# Patient Record
Sex: Male | Born: 1980 | Race: White | Hispanic: No | Marital: Single | Smoking: Never smoker
Health system: Southern US, Community
[De-identification: ages and names within clinical notes are randomized; demographics above are authoritative.]

## PROBLEM LIST (undated history)

## (undated) DIAGNOSIS — G8250 Quadriplegia, unspecified: Secondary | ICD-10-CM

---
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
## 2003-11-22 DIAGNOSIS — G8250 Quadriplegia, unspecified: Secondary | ICD-10-CM

## 2003-11-22 HISTORY — DX: Quadriplegia, unspecified: G82.50

## 2003-11-22 HISTORY — PX: SPINAL FUSION: SHX223

## 2008-05-06 ENCOUNTER — Ambulatory Visit: Payer: Self-pay | Admitting: Urology

## 2008-12-08 ENCOUNTER — Inpatient Hospital Stay (HOSPITAL_COMMUNITY): Admission: EM | Admit: 2008-12-08 | Discharge: 2008-12-11 | Payer: Self-pay | Admitting: Emergency Medicine

## 2008-12-10 ENCOUNTER — Ambulatory Visit: Payer: Self-pay | Admitting: Infectious Diseases

## 2009-11-17 ENCOUNTER — Emergency Department (HOSPITAL_COMMUNITY): Admission: EM | Admit: 2009-11-17 | Discharge: 2009-11-17 | Payer: Self-pay | Admitting: Emergency Medicine

## 2011-02-21 LAB — CBC
Hemoglobin: 13.9 g/dL (ref 13.0–17.0)
MCHC: 33.2 g/dL (ref 30.0–36.0)
MCV: 88.8 fL (ref 78.0–100.0)
RBC: 4.72 MIL/uL (ref 4.22–5.81)
WBC: 6.5 10*3/uL (ref 4.0–10.5)

## 2011-02-21 LAB — URINE MICROSCOPIC-ADD ON

## 2011-02-21 LAB — URINALYSIS, ROUTINE W REFLEX MICROSCOPIC
Bilirubin Urine: NEGATIVE
Glucose, UA: NEGATIVE mg/dL
Hgb urine dipstick: NEGATIVE
Protein, ur: NEGATIVE mg/dL
Urobilinogen, UA: 0.2 mg/dL (ref 0.0–1.0)

## 2011-02-21 LAB — POCT I-STAT, CHEM 8
Calcium, Ion: 1.19 mmol/L (ref 1.12–1.32)
Chloride: 102 mEq/L (ref 96–112)
Creatinine, Ser: 0.8 mg/dL (ref 0.4–1.5)
Glucose, Bld: 82 mg/dL (ref 70–99)
Potassium: 4.2 mEq/L (ref 3.5–5.1)

## 2011-02-21 LAB — DIFFERENTIAL
Lymphs Abs: 1.3 10*3/uL (ref 0.7–4.0)
Monocytes Absolute: 0.6 10*3/uL (ref 0.1–1.0)
Monocytes Relative: 9 % (ref 3–12)
Neutro Abs: 4.5 10*3/uL (ref 1.7–7.7)
Neutrophils Relative %: 69 % (ref 43–77)

## 2011-03-07 LAB — COMPREHENSIVE METABOLIC PANEL
Albumin: 3.8 g/dL (ref 3.5–5.2)
BUN: 12 mg/dL (ref 6–23)
Calcium: 9.3 mg/dL (ref 8.4–10.5)
Chloride: 99 mEq/L (ref 96–112)
Creatinine, Ser: 1.27 mg/dL (ref 0.4–1.5)
GFR calc non Af Amer: >60 mL/min (ref >60)
Total Bilirubin: 1 mg/dL (ref 0.3–1.2)

## 2011-03-07 LAB — DIFFERENTIAL
Basophils Absolute: 0 10*3/uL (ref 0.0–0.1)
Basophils Relative: 0 % (ref 0–1)
Monocytes Relative: 9 % (ref 3–12)
Neutro Abs: 12.2 10*3/uL — ABNORMAL HIGH (ref 1.7–7.7)
Neutrophils Relative %: 84 % — ABNORMAL HIGH (ref 43–77)

## 2011-03-07 LAB — URINE MICROSCOPIC-ADD ON

## 2011-03-07 LAB — BASIC METABOLIC PANEL
BUN: 11 mg/dL (ref 6–23)
BUN: 6 mg/dL (ref 6–23)
Chloride: 109 mEq/L (ref 96–112)
Creatinine, Ser: 0.67 mg/dL (ref 0.4–1.5)
GFR calc Af Amer: >60 mL/min (ref >60)
GFR calc non Af Amer: >60 mL/min (ref >60)
Glucose, Bld: 103 mg/dL — ABNORMAL HIGH (ref 70–99)
Potassium: 3.4 mEq/L — ABNORMAL LOW (ref 3.5–5.1)

## 2011-03-07 LAB — URINALYSIS, ROUTINE W REFLEX MICROSCOPIC
Bilirubin Urine: NEGATIVE
Glucose, UA: NEGATIVE mg/dL
Ketones, ur: 15 mg/dL — AB
Specific Gravity, Urine: 1.01 (ref 1.005–1.030)
pH: 6 (ref 5.0–8.0)

## 2011-03-07 LAB — RETICULOCYTES
RBC.: 4.26 MIL/uL (ref 4.22–5.81)
Retic Count, Absolute: 34.1 10*3/uL (ref 19.0–186.0)

## 2011-03-07 LAB — CBC
HCT: 35.7 % — ABNORMAL LOW (ref 39.0–52.0)
HCT: 41.9 % (ref 39.0–52.0)
Hemoglobin: 11.8 g/dL — ABNORMAL LOW (ref 13.0–17.0)
Hemoglobin: 14 g/dL (ref 13.0–17.0)
MCHC: 32.9 g/dL (ref 30.0–36.0)
MCHC: 33.4 g/dL (ref 30.0–36.0)
MCV: 89 fL (ref 78.0–100.0)
Platelets: 127 10*3/uL — ABNORMAL LOW (ref 150–400)
RDW: 14.3 % (ref 11.5–15.5)
RDW: 14.5 % (ref 11.5–15.5)
WBC: 6.7 10*3/uL (ref 4.0–10.5)

## 2011-03-07 LAB — FERRITIN: Ferritin: 208 ng/mL (ref 22–322)

## 2011-03-07 LAB — CULTURE, BLOOD (ROUTINE X 2): Culture: NO GROWTH

## 2011-03-07 LAB — URINE CULTURE

## 2011-03-07 LAB — MAGNESIUM: Magnesium: 2.1 mg/dL (ref 1.5–2.5)

## 2011-03-07 LAB — FOLATE
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
## 2011-04-05 NOTE — Discharge Summary (Signed)
Matthew Davenport, Matthew Davenport               ACCOUNT NO.:  [id number]   MEDICAL RECORD NO.:  [id number]          PATIENT TYPE:  INP   LOCATION:  0159                         FACILITY:  [facility]   PHYSICIAN:  Michelene Gardener, MD    DATE OF BIRTH:  [birth year]   DATE OF ADMISSION:  12/08/2008  DATE OF DISCHARGE:                               DISCHARGE SUMMARY   Initial discharge summary was done by Marcellus Scott, MD   ADDENDUM:  Addendum to discharge.  The patient was supposed to go home  yesterday on January 20th but he was having some nausea and he vomited  once so his discharge was held and he was observed for 24 hours.  Since  that time the patient has been doing good.  Currently, this morning he  is asymptomatic.  He denied nausea, there is no vomiting and there is no  abdominal pain.  His abdominal exam is negative without tenderness.  The  patient was evaluated by infectious disease yesterday and he was  recommended to continue on Ceftin for 8 more days.  He will be  discharged home today on all preadmission medications in addition to  Ceftin.  For more details about his hospitalization please check  previous discharge summary done by Dr. Marcellus Scott on January 20th.  Total assessment time 50 minutes.      Michelene Gardener, MD  Electronically Signed     NAE/MEDQ  D:  12/11/2008  T:  12/11/2008  Job:  [id number]

## 2015-11-25 DIAGNOSIS — I82513 Chronic embolism and thrombosis of femoral vein, bilateral: Secondary | ICD-10-CM | POA: Insufficient documentation

## 2017-11-22 ENCOUNTER — Emergency Department: Payer: BC Managed Care – PPO

## 2017-11-22 ENCOUNTER — Other Ambulatory Visit: Payer: Self-pay

## 2017-11-22 ENCOUNTER — Encounter: Payer: Self-pay | Admitting: Emergency Medicine

## 2017-11-22 ENCOUNTER — Inpatient Hospital Stay
Admission: EM | Admit: 2017-11-22 | Discharge: 2017-11-24 | DRG: 872 | Disposition: A | Discharge status: Home / Self Care | Payer: BC Managed Care – PPO | Attending: Internal Medicine | Admitting: Internal Medicine

## 2017-11-22 DIAGNOSIS — G8222 Paraplegia, incomplete: Secondary | ICD-10-CM | POA: Diagnosis present

## 2017-11-22 DIAGNOSIS — Z86718 Personal history of other venous thrombosis and embolism: Secondary | ICD-10-CM | POA: Diagnosis not present

## 2017-11-22 DIAGNOSIS — M62838 Other muscle spasm: Secondary | ICD-10-CM | POA: Diagnosis present

## 2017-11-22 DIAGNOSIS — N340 Urethral abscess: Secondary | ICD-10-CM | POA: Diagnosis present

## 2017-11-22 DIAGNOSIS — Z888 Allergy status to other drugs, medicaments and biological substances status: Secondary | ICD-10-CM | POA: Diagnosis not present

## 2017-11-22 DIAGNOSIS — N319 Neuromuscular dysfunction of bladder, unspecified: Secondary | ICD-10-CM | POA: Diagnosis present

## 2017-11-22 DIAGNOSIS — Z981 Arthrodesis status: Secondary | ICD-10-CM

## 2017-11-22 DIAGNOSIS — Z7901 Long term (current) use of anticoagulants: Secondary | ICD-10-CM

## 2017-11-22 DIAGNOSIS — N5089 Other specified disorders of the male genital organs: Secondary | ICD-10-CM | POA: Diagnosis present

## 2017-11-22 DIAGNOSIS — A419 Sepsis, unspecified organism: Secondary | ICD-10-CM | POA: Diagnosis not present

## 2017-11-22 DIAGNOSIS — N453 Epididymo-orchitis: Secondary | ICD-10-CM | POA: Diagnosis present

## 2017-11-22 DIAGNOSIS — Z79899 Other long term (current) drug therapy: Secondary | ICD-10-CM

## 2017-11-22 DIAGNOSIS — Z91018 Allergy to other foods: Secondary | ICD-10-CM | POA: Diagnosis not present

## 2017-11-22 HISTORY — DX: Quadriplegia, unspecified: G82.50

## 2017-11-22 LAB — HEMOGLOBIN A1C
Hgb A1c MFr Bld: 5.2 % (ref 4.8–5.6)
Mean Plasma Glucose: 102.54 mg/dL

## 2017-11-22 LAB — URINALYSIS, ROUTINE W REFLEX MICROSCOPIC
Bilirubin Urine: NEGATIVE
Glucose, UA: NEGATIVE mg/dL
Ketones, ur: 20 mg/dL — AB
Leukocytes, UA: NEGATIVE
NITRITE: NEGATIVE
Protein, ur: NEGATIVE mg/dL
SPECIFIC GRAVITY, URINE: 1.006 (ref 1.005–1.030)
pH: 6 (ref 5.0–8.0)

## 2017-11-22 LAB — CBC WITH DIFFERENTIAL/PLATELET
BASOS ABS: 0.1 10*3/uL (ref 0–0.1)
Basophils Relative: 1 %
Eosinophils Absolute: 0 10*3/uL (ref 0–0.7)
Eosinophils Relative: 0 %
HCT: 41.9 % (ref 40.0–52.0)
Hemoglobin: 14.1 g/dL (ref 13.0–18.0)
Lymphocytes Relative: 2 %
Lymphs Abs: 0.3 10*3/uL — ABNORMAL LOW (ref 1.0–3.6)
MCH: 28.2 pg (ref 26.0–34.0)
MCHC: 33.6 g/dL (ref 32.0–36.0)
MCV: 83.9 fL (ref 80.0–100.0)
MONO ABS: 0.6 10*3/uL (ref 0.2–1.0)
MONOS PCT: 4 %
NEUTROS ABS: 14.6 10*3/uL — AB (ref 1.4–6.5)
Neutrophils Relative %: 93 %
Platelets: 194 10*3/uL (ref 150–440)
RBC: 5 MIL/uL (ref 4.40–5.90)
RDW: 14.4 % (ref 11.5–14.5)
WBC: 15.7 10*3/uL — ABNORMAL HIGH (ref 3.8–10.6)

## 2017-11-22 LAB — COMPREHENSIVE METABOLIC PANEL
ALBUMIN: 4.2 g/dL (ref 3.5–5.0)
ALT: 16 U/L — ABNORMAL LOW (ref 17–63)
ANION GAP: 10 (ref 5–15)
AST: 25 U/L (ref 15–41)
Alkaline Phosphatase: 42 U/L (ref 38–126)
BUN: 10 mg/dL (ref 6–20)
CHLORIDE: 102 mmol/L (ref 101–111)
CO2: 24 mmol/L (ref 22–32)
Calcium: 9.3 mg/dL (ref 8.9–10.3)
Creatinine, Ser: 0.58 mg/dL — ABNORMAL LOW (ref 0.61–1.24)
GFR calc Af Amer: >60 mL/min (ref >60)
GFR calc non Af Amer: >60 mL/min (ref >60)
GLUCOSE: 113 mg/dL — AB (ref 65–99)
Potassium: 3.5 mmol/L (ref 3.5–5.1)
SODIUM: 136 mmol/L (ref 135–145)
TOTAL PROTEIN: 8 g/dL (ref 6.5–8.1)
Total Bilirubin: 1.2 mg/dL (ref 0.3–1.2)

## 2017-11-22 LAB — TSH: TSH: 0.387 u[IU]/mL (ref 0.350–4.500)

## 2017-11-22 LAB — LACTIC ACID, PLASMA: LACTIC ACID, VENOUS: 1 mmol/L (ref 0.5–1.9)

## 2017-11-22 MED ORDER — BACLOFEN 5 MG HALF TABLET
25.0000 mg | ORAL_TABLET | Freq: Four times a day (QID) | ORAL | Status: DC
  Filled 2017-11-22: qty 1

## 2017-11-22 MED ORDER — ACETAMINOPHEN 325 MG PO TABS
650.0000 mg | ORAL_TABLET | Freq: Four times a day (QID) | ORAL | Status: DC | PRN
  Administered 2017-11-22 – 2017-11-23 (×3): 650 mg via ORAL
  Filled 2017-11-22 (×3): qty 2

## 2017-11-22 MED ORDER — APIXABAN 5 MG PO TABS
5.0000 mg | ORAL_TABLET | Freq: Two times a day (BID) | ORAL | Status: DC
  Administered 2017-11-22 – 2017-11-24 (×5): 5 mg via ORAL
  Filled 2017-11-22 (×6): qty 1

## 2017-11-22 MED ORDER — PIPERACILLIN-TAZOBACTAM 3.375 G IVPB 30 MIN
3.3750 g | Freq: Once | INTRAVENOUS | Status: AC
  Administered 2017-11-22: 3.375 g via INTRAVENOUS
  Filled 2017-11-22: qty 50

## 2017-11-22 MED ORDER — ACETAMINOPHEN 650 MG RE SUPP: 650.0000 mg | Freq: Four times a day (QID) | RECTAL | Status: DC | PRN

## 2017-11-22 MED ORDER — TAMSULOSIN HCL 0.4 MG PO CAPS
0.4000 mg | ORAL_CAPSULE | Freq: Every day | ORAL | Status: DC
  Administered 2017-11-22 – 2017-11-24 (×2): 0.4 mg via ORAL
  Filled 2017-11-22 (×3): qty 1

## 2017-11-22 MED ORDER — INFLUENZA VAC SPLIT QUAD 0.5 ML IM SUSY
0.5000 mL | PREFILLED_SYRINGE | INTRAMUSCULAR | Status: DC
  Filled 2017-11-22: qty 0.5

## 2017-11-22 MED ORDER — DIAZEPAM 5 MG PO TABS
2.5000 mg | ORAL_TABLET | Freq: Every evening | ORAL | Status: DC | PRN
  Administered 2017-11-24: 2.5 mg via ORAL
  Filled 2017-11-22: qty 1

## 2017-11-22 MED ORDER — BACLOFEN 10 MG PO TABS
25.0000 mg | ORAL_TABLET | Freq: Four times a day (QID) | ORAL | Status: DC
  Administered 2017-11-22: 25 mg via ORAL
  Filled 2017-11-22 (×3): qty 2.5

## 2017-11-22 MED ORDER — FINASTERIDE 1 MG PO TABS
1.0000 mg | ORAL_TABLET | Freq: Every day | ORAL | Status: DC
  Administered 2017-11-23: 1 mg via ORAL
  Filled 2017-11-22 (×2): qty 1

## 2017-11-22 MED ORDER — VANCOMYCIN HCL IN DEXTROSE 1-5 GM/200ML-% IV SOLN
1000.0000 mg | Freq: Three times a day (TID) | INTRAVENOUS | Status: DC
  Administered 2017-11-22 (×3): 1000 mg via INTRAVENOUS
  Filled 2017-11-22 (×4): qty 200

## 2017-11-22 MED ORDER — SENNOSIDES-DOCUSATE SODIUM 8.6-50 MG PO TABS
2.0000 | ORAL_TABLET | Freq: Every day | ORAL | Status: DC
  Administered 2017-11-22 – 2017-11-23 (×2): 2 via ORAL
  Filled 2017-11-22 (×2): qty 2

## 2017-11-22 MED ORDER — BACLOFEN 10 MG PO TABS
25.0000 mg | ORAL_TABLET | Freq: Four times a day (QID) | ORAL | Status: DC
  Administered 2017-11-22 – 2017-11-24 (×7): 25 mg via ORAL
  Filled 2017-11-22 (×9): qty 2.5

## 2017-11-22 MED ORDER — PIPERACILLIN-TAZOBACTAM 3.375 G IVPB
3.3750 g | Freq: Three times a day (TID) | INTRAVENOUS | Status: DC
  Administered 2017-11-22 – 2017-11-23 (×4): 3.375 g via INTRAVENOUS
  Filled 2017-11-22 (×4): qty 50

## 2017-11-22 MED ORDER — ONDANSETRON HCL 4 MG/2ML IJ SOLN: 4.0000 mg | Freq: Four times a day (QID) | INTRAMUSCULAR | Status: DC | PRN

## 2017-11-22 MED ORDER — SODIUM CHLORIDE 0.9 % IV SOLN
INTRAVENOUS | Status: DC
  Administered 2017-11-22 – 2017-11-24 (×3): via INTRAVENOUS

## 2017-11-22 MED ORDER — ONDANSETRON HCL 4 MG PO TABS: 4.0000 mg | ORAL_TABLET | Freq: Four times a day (QID) | ORAL | Status: DC | PRN

## 2017-11-22 MED ORDER — VANCOMYCIN HCL IN DEXTROSE 1-5 GM/200ML-% IV SOLN
1000.0000 mg | Freq: Once | INTRAVENOUS | Status: AC
  Administered 2017-11-22: 1000 mg via INTRAVENOUS
  Filled 2017-11-22: qty 200

## 2017-11-22 MED ORDER — DOCUSATE SODIUM 100 MG PO CAPS
100.0000 mg | ORAL_CAPSULE | Freq: Two times a day (BID) | ORAL | Status: DC
  Administered 2017-11-22: 100 mg via ORAL
  Filled 2017-11-22 (×5): qty 1

## 2017-11-22 MED ORDER — BACLOFEN 5 MG HALF TABLET
25.0000 mg | ORAL_TABLET | Freq: Four times a day (QID) | ORAL | Status: DC
  Administered 2017-11-22: 25 mg via ORAL
  Filled 2017-11-22 (×4): qty 1

## 2017-11-22 NOTE — Progress Notes (Signed)
Pharmacy Antibiotic Note  DAYVON DAX is a 37 y.o. male admitted on 11/22/2017 with sepsis s/t to swollen testicle.  Pharmacy has been consulted for vanc/zosyn dosing.  Plan: Patient received vanc 1g and zosyn 3.375g IV x 1 in ED  Will f/u w/ vanc 1g IV q8h w/ 6 hour stack dose Will draw vanc trough 01/03 @ 0600 prior to 4th  Will continue zosyn 3.375g IV q8h  Ke 0.1049 T1/2 6 ~ 8 hrs Goal trough 15 - 20 mcg/mL  Height: 6\' 1"  (185.4 cm) Weight: 145 lb (65.8 kg) IBW/kg (Calculated) : 79.9  Temp (24hrs), Avg:98.8 F (37.1 C), Min:98.4 F (36.9 C), Max:99.1 F (37.3 C)  Recent Labs  Lab 11/22/17 0055  WBC 15.7*  CREATININE 0.58*  LATICACIDVEN 1.0    Estimated Creatinine Clearance: 121.1 mL/min (A) (by C-G formula based on SCr of 0.58 mg/dL (L)).    Allergies  Allergen Reactions  . Banana Nausea Only    Stomach Cramps   . Ferrous Gluconate Nausea And Vomiting    Thank you for allowing pharmacy to be a part of this patient's care.  Tobie Lords, PharmD, BCPS Clinical Pharmacist 11/22/2017

## 2017-11-22 NOTE — ED Provider Notes (Signed)
[facility] Emergency Department Provider Note    First MD Initiated Contact with Patient 11/22/17 [phone]     (approximate)  I have reviewed the triage vital signs and the nursing notes.   HISTORY  Chief Complaint No chief complaint on file.    HPI Matthew Davenport is a 37 y.o. 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  Patient's mother noted redness to the testicle patient was then noted to be febrile with a temperature of 101.  In addition the patient noted muscle spasm of the lower abdominal wall.  Of note patient states that he was recently treated with Cipro for UTI.  Patient states that symptoms are consistent with when he was diagnosed with bilateral lower extremity DVTs.   Past Medical History:  Diagnosis Date  . Tetraplegia ([place]) 2005    There are no active problems to display for this patient.   Past Surgical History:  Procedure Laterality Date  . SPINAL FUSION  2005  . [place]    Prior to Admission medications   Not on File    Allergies No known drug allergies No family history on file.  Social History Social History   Tobacco Use  . Smoking status: Not on file  Substance Use Topics  . Alcohol use: Not on file  . Drug use: Not on file    Review of Systems Constitutional: No fever/chills Eyes: No visual changes. ENT: No sore throat. Cardiovascular: Denies chest pain. Respiratory: Denies shortness of breath. Gastrointestinal: No abdominal pain.  No nausea, no vomiting.  No diarrhea.  No constipation. Genitourinary: Negative for dysuria.  Positive for scrotal swelling and redness Musculoskeletal: Negative for neck pain.  Negative for back pain. Integumentary: Negative for rash. Neurological:  Negative for headaches, focal weakness or numbness.   ____________________________________________   PHYSICAL EXAM:  VITAL SIGNS: ED Triage Vitals  Enc Vitals Group     BP 11/22/17 0045 (!) 163/113     Pulse Rate 11/22/17 0045 (!) 103     Resp 11/22/17 0045 18     Temp 11/22/17 0045 98.4 F (36.9 C)     Temp Source 11/22/17 0045 Oral     SpO2 11/22/17 0045 97 %     Weight 11/22/17 0050 65.8 kg (145 lb)     Height --      Head Circumference --      Peak Flow --      Pain Score --      Pain Loc --      Pain Edu? --      Excl. in [place]? --     Constitutional: Alert and oriented. Well appearing and in no acute distress. Eyes: Conjunctivae are normal. Head: Atraumatic. Mouth/Throat: Mucous membranes are moist.  Oropharynx non-erythematous. Neck: No stridor.   Cardiovascular: Tachycardia regular rhythm. Good peripheral circulation. Grossly normal heart sounds. Respiratory: Normal respiratory effort.  No retractions. Lungs CTAB. Gastrointestinal: Soft and nontender. No distention.  Genitourinary: Blanching scrotal erythema and large left testicle Musculoskeletal: No lower extremity tenderness nor edema. No gross deformities of extremities. Neurologic:  Normal speech and language. No gross focal neurologic deficits are appreciated.  Skin:  Skin is warm, dry and intact. No rash noted. Psychiatric: Mood and affect are normal. Speech and behavior are normal.  ____________________________________________  LABS (all labs ordered are listed, but only abnormal results are displayed)  Labs Reviewed  COMPREHENSIVE METABOLIC PANEL - Abnormal; Notable for the following components:      Result Value   Glucose, Bld 113 (*)    Creatinine, Ser 0.58 (*)    ALT 16 (*)    All other components within normal limits  CBC WITH DIFFERENTIAL/PLATELET - Abnormal; Notable for the following components:   WBC 15.7 (*)    Neutro Abs 14.6 (*)    Lymphs Abs 0.3 (*)    All other components within  normal limits  CULTURE, BLOOD (ROUTINE X 2)  CULTURE, BLOOD (ROUTINE X 2)  URINE CULTURE  LACTIC ACID, PLASMA  URINALYSIS, ROUTINE W REFLEX MICROSCOPIC  LACTIC ACID, PLASMA   ____________________________________________  EKG  ED ECG REPORT I, [place] N Roiza Wiedel, the attending physician, personally viewed and interpreted this ECG.   Date: 11/22/2017  EKG Time: 3:46 AM  Rate: 91  Rhythm: Normal sinus rhythm  Axis: Normal  Intervals: Normal  ST&T Change: None  ____________________________________________  RADIOLOGY I, [place] N Kamden Stanislaw, personally viewed and evaluated these images (plain radiographs) as part of my medical decision making, as well as reviewing the written report by the radiologist.  [place] Scrotom W/doppler  Result Date: 11/22/2017 CLINICAL DATA:  Acute onset of bilateral scrotal swelling. EXAM: SCROTAL ULTRASOUND DOPPLER ULTRASOUND OF THE TESTICLES TECHNIQUE: Complete ultrasound examination of the testicles, epididymis, and other scrotal structures was performed. Color and spectral Doppler ultrasound were also utilized to evaluate blood flow to the testicles. COMPARISON:  None. FINDINGS: Right testicle Measurements: 4.1 x 2.3 x 2.6 cm. No mass or microlithiasis visualized. Left testicle Measurements: 4.6 x 3.5 x 3.5 cm. No mass visualized. Diffusely heterogeneous in appearance, and asymmetrically enlarged, with underlying hyperemia, raising concern for orchitis. A single small calcification is noted within the left testis. Right epididymis:  A small 0.4 cm epididymal head cyst is noted. Left epididymis: Asymmetrically enlarged, with hyperemia, concerning for epididymitis. Hydrocele: Complex fluid with diffuse internal echoes is noted surrounding the left testis and epididymis, concerning for small pyocele. Varicocele:  None visualized. Pulsed Doppler interrogation of both testes demonstrates normal low resistance arterial and venous waveforms bilaterally. There is asymmetric  scrotal wall thickening on the left. IMPRESSION: 1. No evidence of testicular torsion. 2. Left-sided epididymo-orchitis, with small left-sided pyocele. No gas is seen at this time. 3. Left-sided scrotal wall thickening reflects overlying cellulitis. These results were called by telephone at the time of interpretation on 11/22/2017 at 3:46 am to Dr. Marjean Donna , who verbally acknowledged these results. Electronically Signed   By: Garald Balding M.D.   On: 11/22/2017 03:49    ____________________________________________   PROCEDURES  Critical Care performed: CRITICAL CARE Performed by: Gregor Hams   Total critical care time: 45 minutes  Critical care time was exclusive of separately billable procedures and treating other patients.  Critical care was necessary to treat or prevent imminent or life-threatening deterioration.  Critical care was time spent personally by me on the following activities: development of treatment plan with patient and/or surrogate as well as nursing, discussions with consultants, evaluation of patient's response to treatment, examination of patient, obtaining history from patient or surrogate, ordering and performing treatments and interventions, ordering and review of laboratory studies, ordering and review of radiographic studies, pulse oximetry and re-evaluation of patient's condition.  Procedures   ____________________________________________   INITIAL IMPRESSION / ASSESSMENT AND PLAN / ED COURSE  As part of my  medical decision making, I reviewed the following data within the electronic MEDICAL RECORD NUMBER4 year old male presented with above-stated history and physical exam concerning for possible epididymitis versus epididymal orchitis with overlying scrotal cellulitis no evidence of necrosis noted on the scrotum.  Patient met sepsis criteria on arrival as patient is tachycardic hypotensive history of fever.  Laboratory data notable for leukocytosis with a  white blood cell count of 15.7.  Patient received IV vancomycin 1 g and Zosyn 3.375 g while in the emergency department.  Patient discussed with Dr. Junious Silk urology after reviewing ultrasound which revealed left epididymal orchitis with pyocele.  Dr. Junious Silk will evaluate the patient in the emergency department.  Patient discussed with Dr. Marcille Blanco for hospital admission for further evaluation and management ____________________________________________  FINAL CLINICAL IMPRESSION(S) / ED DIAGNOSES  Final diagnoses:  Epididymoorchitis  Sepsis, due to unspecified organism [facility])     MEDICATIONS GIVEN DURING THIS VISIT:  Medications  piperacillin-tazobactam (ZOSYN) IVPB 3.375 g (0 g Intravenous Stopped 11/22/17 0148)  vancomycin (VANCOCIN) IVPB 1000 mg/200 mL premix (0 mg Intravenous Stopped 11/22/17 0219)     ED Discharge Orders    None       Note:  This document was prepared using Dragon voice recognition software and may include unintentional dictation errors.    Gregor Hams, MD 11/22/17 [phone]

## 2017-11-22 NOTE — Progress Notes (Signed)
Bilateral scrotal swelling and pain. Vital signs reviewed. Sepsis due to Epididymitis With orchitis Continue current treatment with antibiotics, follow-up cultures and CBC.  I discussed with the patient and his mother, and RN.  Time spent about 25 minutes.

## 2017-11-22 NOTE — ED Notes (Signed)
Attempted to call report. Charge nurse will call back in a few minutes to take report.

## 2017-11-22 NOTE — Consult Note (Addendum)
1610: Full consult to follow - pt seen and examined in ED. Chart, [place] images and labs reviewed. No worrisome findings on exam - erythema and swelling of left testicle and epididymus noted with typical induration. Skin normal - no necrosis. No fluctuance, no crepitus.   Consult: Left epididymoorchitis Requested by: Dr. Marjean Donna  H&P [name] is a 37 year old paraplegic he does CIC for neurogenic bladder.  He went to a movie yesterday and then yesterday afternoon started to have malaise, dysreflexic type symptoms and fever.  His mom assists him with CIC and they noticed left-sided scrotal swelling.  He was brought to the emergency department where left-sided scrotal swelling and erythema was noted.  Ultrasound revealed hyperemia of the left testicle and epididymis and typical fluid around the testicle (this was described as a possible "pyocele" on the ultrasound). White count 15, urine bland.   He has a history of 2-3 cystitis episodes per year and follows with Dr. Tresa Moore. He was only doing CIC twice a day and reflex voiding with a condom catheter.  Dr. Tresa Moore increased frequency of catheterization to 4 times a day and is noted, but patient and mom tell me that her only doing it 3 times a day. Dr. Zettie Pho last office note, the patient had a CT scan in 2018 with punctate stones likely not contributing to UTI (on [place] - I reviewed all the images) and no hydronephrosis.  Patient is due for follow-up With Dr. Tresa Moore December 2019 for BMP, KUB and renal ultrasound.   Past Medical History:  Diagnosis Date  . Tetraplegia ([place]) 2005   Past Surgical History:  Procedure Laterality Date  . SPINAL FUSION  2005  . WISDOM TOOTH EXTRACTION  2000    Home Medications:  Medications Prior to Admission  Medication Sig Dispense Refill Last Dose  . baclofen (LIORESAL) 10 MG tablet Take 25 mg by mouth 4 (four) times daily.  0 11/21/2017 at Unknown time  . diazepam (VALIUM) 5 MG tablet Take 2.5 mg by mouth at  bedtime as needed for muscle spasms.   prn at prn  . ELIQUIS 5 MG TABS tablet Take 5 mg by mouth 2 (two) times daily.  0 11/21/2017 at Unknown time  . finasteride (PROPECIA) 1 MG tablet Take 1 mg by mouth daily.  0 11/21/2017 at Unknown time  . senna-docusate (SENOKOT-S) 8.6-50 MG tablet Take 2 tablets by mouth daily.   11/21/2017 at Unknown time  . tamsulosin (FLOMAX) 0.4 MG CAPS capsule Take 0.4 mg by mouth daily.  0 11/21/2017 at Unknown time   Allergies:  Allergies  Allergen Reactions  . Banana Nausea Only    Stomach Cramps   . Ferrous Gluconate Nausea And Vomiting    Family History  Problem Relation Age of Onset  . CAD Paternal Aunt    Social History:  has no tobacco, alcohol, and drug history on file.  ROS: A complete review of systems was performed.  All systems are negative except for pertinent findings as noted. Review of Systems  All other systems reviewed and are negative.    Physical Exam:  Vital signs in last 24 hours: Temp:  [98.4 F (36.9 C)-99.1 F (37.3 C)] 99.1 F (37.3 C) (01/02 9604) Pulse Rate:  [89-121] 101 (01/02 0632) Resp:  [12-24] 22 (01/02 0530) BP: (94-163)/(58-113) 94/58 (01/02 5409) SpO2:  [96 %-98 %] 96 % (01/02 8119) Weight:  [65.8 kg (145 lb)] 65.8 kg (145 lb) (01/02 0050) General:  Alert and oriented, No acute  distress HEENT: Normocephalic, atraumatic Cardiovascular: Regular rate and rhythm Lungs: Regular rate and effort Abdomen: Soft, nontender, nondistended, no abdominal masses Back: No CVA tenderness Extremities: No edema Neurologic: Grossly intact GU: condom cath in place - no penile swelling or erythema. Overall mild scrotal swelling, erythema left greater than right.  Skin is healthy without necrosis.  On exam the right testicle is palpably normal.  The left testicle and epididymis are indurated but there is no fluctuance or crepitus. The posterior scrotum appears normal and the perineum and inner thighs are normal.  Laboratory Data:   Results for orders placed or performed during the hospital encounter of 11/22/17 (from the past 24 hour(s))  Comprehensive metabolic panel     Status: Abnormal   Collection Time: 11/22/17 12:55 AM  Result Value Ref Range   Sodium 136 135 - 145 mmol/L   Potassium 3.5 3.5 - 5.1 mmol/L   Chloride 102 101 - 111 mmol/L   CO2 24 22 - 32 mmol/L   Glucose, Bld 113 (H) 65 - 99 mg/dL   BUN 10 6 - 20 mg/dL   Creatinine, Ser 0.58 (L) 0.61 - 1.24 mg/dL   Calcium 9.3 8.9 - 10.3 mg/dL   Total Protein 8.0 6.5 - 8.1 g/dL   Albumin 4.2 3.5 - 5.0 g/dL   AST 25 15 - 41 U/L   ALT 16 (L) 17 - 63 U/L   Alkaline Phosphatase 42 38 - 126 U/L   Total Bilirubin 1.2 0.3 - 1.2 mg/dL   GFR calc non Af Amer >60 >60 mL/min   GFR calc Af Amer >60 >60 mL/min   Anion gap 10 5 - 15  CBC WITH DIFFERENTIAL     Status: Abnormal   Collection Time: 11/22/17 12:55 AM  Result Value Ref Range   WBC 15.7 (H) 3.8 - 10.6 K/uL   RBC 5.00 4.40 - 5.90 MIL/uL   Hemoglobin 14.1 13.0 - 18.0 g/dL   HCT 41.9 40.0 - 52.0 %   MCV 83.9 80.0 - 100.0 fL   MCH 28.2 26.0 - 34.0 pg   MCHC 33.6 32.0 - 36.0 g/dL   RDW 14.4 11.5 - 14.5 %   Platelets 194 150 - 440 K/uL   Neutrophils Relative % 93 %   Neutro Abs 14.6 (H) 1.4 - 6.5 K/uL   Lymphocytes Relative 2 %   Lymphs Abs 0.3 (L) 1.0 - 3.6 K/uL   Monocytes Relative 4 %   Monocytes Absolute 0.6 0.2 - 1.0 K/uL   Eosinophils Relative 0 %   Eosinophils Absolute 0.0 0 - 0.7 K/uL   Basophils Relative 1 %   Basophils Absolute 0.1 0 - 0.1 K/uL  Blood Culture (routine x 2)     Status: None (Preliminary result)   Collection Time: 11/22/17 12:55 AM  Result Value Ref Range   Specimen Description BLOOD RT HAND    Special Requests      BOTTLES DRAWN AEROBIC AND ANAEROBIC Blood Culture results may not be optimal due to an excessive volume of blood received in culture bottles   Culture      NO GROWTH < 12 HOURS Performed at [facility], [address] [place], [place] [address]     Report Status PENDING   Blood Culture (routine x 2)     Status: None (Preliminary result)   Collection Time: 11/22/17 12:55 AM  Result Value Ref Range   Specimen Description BLOOD LT HAND    Special Requests  BOTTLES DRAWN AEROBIC AND ANAEROBIC Blood Culture results may not be optimal due to an excessive volume of blood received in culture bottles   Culture      NO GROWTH < 12 HOURS Performed at [facility], [address] [place], [place] [address]    Report Status PENDING   Lactic acid, plasma     Status: None   Collection Time: 11/22/17 12:55 AM  Result Value Ref Range   Lactic Acid, Venous 1.0 0.5 - 1.9 mmol/L  TSH     Status: None   Collection Time: 11/22/17 12:55 AM  Result Value Ref Range   TSH 0.387 0.350 - 4.500 uIU/mL  Urinalysis, Routine w reflex microscopic     Status: Abnormal   Collection Time: 11/22/17  3:55 AM  Result Value Ref Range   Color, Urine YELLOW (A) YELLOW   APPearance CLEAR (A) CLEAR   Specific Gravity, Urine 1.006 1.005 - 1.030   pH 6.0 5.0 - 8.0   Glucose, UA NEGATIVE NEGATIVE mg/dL   Hgb urine dipstick MODERATE (A) NEGATIVE   Bilirubin Urine NEGATIVE NEGATIVE   Ketones, ur 20 (A) NEGATIVE mg/dL   Protein, ur NEGATIVE NEGATIVE mg/dL   Nitrite NEGATIVE NEGATIVE   Leukocytes, UA NEGATIVE NEGATIVE   RBC / HPF 0-5 0 - 5 RBC/hpf   WBC, UA 0-5 0 - 5 WBC/hpf   Bacteria, UA RARE (A) NONE SEEN   Squamous Epithelial / LPF 0-5 (A) NONE SEEN   Sperm, UA PRESENT    Recent Results (from the past 240 hour(s))  Blood Culture (routine x 2)     Status: None (Preliminary result)   Collection Time: 11/22/17 12:55 AM  Result Value Ref Range Status   Specimen Description BLOOD RT HAND  Final   Special Requests   Final    BOTTLES DRAWN AEROBIC AND ANAEROBIC Blood Culture results may not be optimal due to an excessive volume of blood received in culture bottles   Culture   Final    NO GROWTH < 12 HOURS Performed at [facility], [address], [place], [place] [address]    Report Status PENDING  Incomplete  Blood Culture (routine x 2)     Status: None (Preliminary result)   Collection Time: 11/22/17 12:55 AM  Result Value Ref Range Status   Specimen Description BLOOD LT HAND  Final   Special Requests   Final    BOTTLES DRAWN AEROBIC AND ANAEROBIC Blood Culture results may not be optimal due to an excessive volume of blood received in culture bottles   Culture   Final    NO GROWTH < 12 HOURS Performed at [facility], [address], [place], [place] [address]    Report Status PENDING  Incomplete   Creatinine: Recent Labs    11/22/17 0055  CREATININE 0.58*   Office notes reviewed and CT images reviewed.  Impression/Assessment/plan: Epididymoorchitis-agree with broad-spectrum antibiotics, blood and urine cultures pending.  Neurogenic bladder-continue CIC 4 times a day.   Festus Aloe 11/22/2017, 8:11 AM

## 2017-11-22 NOTE — ED Notes (Signed)
Patient left for ultrasound.

## 2017-11-22 NOTE — ED Notes (Signed)
Nurse unable to get urine at this time due to ultrasound taking patient. Will collect urine when patient returns from u/s.

## 2017-11-22 NOTE — Progress Notes (Signed)
Pt alert and oriented. Resting in room. Family at bedside. Antibiotics infusing. Pt on room air. External cath in place. No complaints at this time. Will continue to monitor.

## 2017-11-22 NOTE — H&P (Signed)
Matthew Davenport is an 37 y.o. male.   Chief Complaint: Testicular swelling HPI: The patient with past medical history of incomplete tetraplegia presents to the emergency department after noticing swelling in his left testicle.  The patient uses urinary catheters and was told by his caregiver that his testicle was swollen and red.  Ultrasound in the emergency department showed orchitis and epididymitis with pyocele.  The patient met criteria for sepsis protocol which was initiated prior to the emergency department staff calling the hospitalist service for admission.  Past Medical History:  Diagnosis Date  . Tetraplegia ([place]) 2005    Past Surgical History:  Procedure Laterality Date  . SPINAL FUSION  2005  . WISDOM TOOTH EXTRACTION  2000    Family History  Problem Relation Age of Onset  . CAD Paternal Aunt    Social History:  has no tobacco, alcohol, and drug history on file.  Allergies:  Allergies  Allergen Reactions  . Banana Nausea Only    Stomach Cramps   . Ferrous Gluconate Nausea And Vomiting    Medications Prior to Admission  Medication Sig Dispense Refill  . baclofen (LIORESAL) 10 MG tablet Take 25 mg by mouth 4 (four) times daily.  0  . diazepam (VALIUM) 5 MG tablet Take 2.5 mg by mouth at bedtime as needed for muscle spasms.    Marland Kitchen ELIQUIS 5 MG TABS tablet Take 5 mg by mouth 2 (two) times daily.  0  . finasteride (PROPECIA) 1 MG tablet Take 1 mg by mouth daily.  0  . senna-docusate (SENOKOT-S) 8.6-50 MG tablet Take 2 tablets by mouth daily.    . tamsulosin (FLOMAX) 0.4 MG CAPS capsule Take 0.4 mg by mouth daily.  0    Results for orders placed or performed during the hospital encounter of 11/22/17 (from the past 48 hour(s))  Comprehensive metabolic panel     Status: Abnormal   Collection Time: 11/22/17 12:55 AM  Result Value Ref Range   Sodium 136 135 - 145 mmol/L   Potassium 3.5 3.5 - 5.1 mmol/L   Chloride 102 101 - 111 mmol/L   CO2 24 22 - 32 mmol/L   Glucose,  Bld 113 (H) 65 - 99 mg/dL   BUN 10 6 - 20 mg/dL   Creatinine, Ser 0.58 (L) 0.61 - 1.24 mg/dL   Calcium 9.3 8.9 - 10.3 mg/dL   Total Protein 8.0 6.5 - 8.1 g/dL   Albumin 4.2 3.5 - 5.0 g/dL   AST 25 15 - 41 U/L   ALT 16 (L) 17 - 63 U/L   Alkaline Phosphatase 42 38 - 126 U/L   Total Bilirubin 1.2 0.3 - 1.2 mg/dL   GFR calc non Af Amer >60 >60 mL/min   GFR calc Af Amer >60 >60 mL/min    Comment: (NOTE) The eGFR has been calculated using the CKD EPI equation. This calculation has not been validated in all clinical situations. eGFR's persistently <60 mL/min signify possible Chronic Kidney Disease.    Anion gap 10 5 - 15    Comment: Performed at [facility], [address] [place], [place] [address]  CBC WITH DIFFERENTIAL     Status: Abnormal   Collection Time: 11/22/17 12:55 AM  Result Value Ref Range   WBC 15.7 (H) 3.8 - 10.6 K/uL   RBC 5.00 4.40 - 5.90 MIL/uL   Hemoglobin 14.1 13.0 - 18.0 g/dL   HCT 41.9 40.0 - 52.0 %   MCV 83.9 80.0 - 100.0 fL  MCH 28.2 26.0 - 34.0 pg   MCHC 33.6 32.0 - 36.0 g/dL   RDW 14.4 11.5 - 14.5 %   Platelets 194 150 - 440 K/uL   Neutrophils Relative % 93 %   Neutro Abs 14.6 (H) 1.4 - 6.5 K/uL   Lymphocytes Relative 2 %   Lymphs Abs 0.3 (L) 1.0 - 3.6 K/uL   Monocytes Relative 4 %   Monocytes Absolute 0.6 0.2 - 1.0 K/uL   Eosinophils Relative 0 %   Eosinophils Absolute 0.0 0 - 0.7 K/uL   Basophils Relative 1 %   Basophils Absolute 0.1 0 - 0.1 K/uL    Comment: Performed at [facility], [address] [place], [place] [address]  Blood Culture (routine x 2)     Status: None (Preliminary result)   Collection Time: 11/22/17 12:55 AM  Result Value Ref Range   Specimen Description BLOOD RT HAND    Special Requests      BOTTLES DRAWN AEROBIC AND ANAEROBIC Blood Culture results may not be optimal due to an excessive volume of blood received in culture bottles   Culture      NO GROWTH < 12 HOURS Performed at [facility],  [address], [place], [place] [address]    Report Status PENDING   Blood Culture (routine x 2)     Status: None (Preliminary result)   Collection Time: 11/22/17 12:55 AM  Result Value Ref Range   Specimen Description BLOOD LT HAND    Special Requests      BOTTLES DRAWN AEROBIC AND ANAEROBIC Blood Culture results may not be optimal due to an excessive volume of blood received in culture bottles   Culture      NO GROWTH < 12 HOURS Performed at [facility], [address] [place], [place] [address]    Report Status PENDING   Lactic acid, plasma     Status: None   Collection Time: 11/22/17 12:55 AM  Result Value Ref Range   Lactic Acid, Venous 1.0 0.5 - 1.9 mmol/L    Comment: Performed at [facility], [address] [place], [place] [address]  TSH     Status: None   Collection Time: 11/22/17 12:55 AM  Result Value Ref Range   TSH 0.387 0.350 - 4.500 uIU/mL    Comment: Performed by a 3rd Generation assay with a functional sensitivity of <=0.01 uIU/mL. Performed at [facility], [address] [place], [place] [address]   Urinalysis, Routine w reflex microscopic     Status: Abnormal   Collection Time: 11/22/17  3:55 AM  Result Value Ref Range   Color, Urine YELLOW (A) YELLOW   APPearance CLEAR (A) CLEAR   Specific Gravity, Urine 1.006 1.005 - 1.030   pH 6.0 5.0 - 8.0   Glucose, UA NEGATIVE NEGATIVE mg/dL   Hgb urine dipstick MODERATE (A) NEGATIVE   Bilirubin Urine NEGATIVE NEGATIVE   Ketones, ur 20 (A) NEGATIVE mg/dL   Protein, ur NEGATIVE NEGATIVE mg/dL   Nitrite NEGATIVE NEGATIVE   Leukocytes, UA NEGATIVE NEGATIVE   RBC / HPF 0-5 0 - 5 RBC/hpf   WBC, UA 0-5 0 - 5 WBC/hpf   Bacteria, UA RARE (A) NONE SEEN   Squamous Epithelial / LPF 0-5 (A) NONE SEEN   Sperm, UA PRESENT     Comment: Performed at [facility], [address] [place], [place] [address]   US Venous Img Lower Bilateral  Result Date: 11/22/2017 CLINICAL DATA:  Acute onset of bilateral upper leg erythema. Personal history of deep venous thrombosis. EXAM: BILATERAL LOWER EXTREMITY VENOUS DOPPLER ULTRASOUND TECHNIQUE: Gray-scale sonography with graded compression, as well as color Doppler and duplex ultrasound were performed to evaluate the lower extremity deep venous systems from the level of the common femoral vein and including the common femoral, femoral, profunda femoral, popliteal and calf veins including the posterior tibial, peroneal and gastrocnemius veins when visible. The superficial great saphenous vein was also interrogated. Spectral Doppler was utilized to evaluate flow at rest and with distal augmentation maneuvers in the common femoral, femoral and popliteal veins. COMPARISON:  None. FINDINGS: RIGHT LOWER EXTREMITY Common Femoral Vein: There appears to be chronic nonocclusive thrombus within the common femoral vein. Saphenofemoral Junction: There appears to be chronic nonocclusive thrombus at the saphenofemoral junction. Profunda Femoral Vein: No evidence of thrombus. Normal compressibility and flow on color Doppler imaging. Femoral Vein: There appears to be chronic nonocclusive thrombus at the proximal femoral vein. Popliteal Vein: No evidence of thrombus. Normal compressibility, respiratory phasicity and response to augmentation. Calf Veins: No evidence of thrombus. Normal compressibility and flow on color Doppler imaging. Superficial Great Saphenous Vein: No evidence of thrombus. Normal compressibility. Venous Reflux:  None. Other Findings:  None. LEFT LOWER EXTREMITY Common Femoral Vein: There appears to be chronic nonocclusive thrombus at the left common femoral vein. Saphenofemoral Junction: No evidence of thrombus. Normal compressibility and flow on color Doppler imaging. Profunda Femoral Vein: No evidence of thrombus. Normal compressibility and flow on color Doppler imaging. Femoral Vein: No evidence of thrombus. Normal compressibility, respiratory  phasicity and response to augmentation. Popliteal Vein: No evidence of thrombus. Normal compressibility, respiratory phasicity and response to augmentation. Calf Veins: No evidence of thrombus. Normal compressibility and flow on color Doppler imaging. Superficial Great Saphenous Vein: No evidence of thrombus. Normal compressibility. Venous Reflux:  None. Other Findings:  None. 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 These results were called by telephone at the time of interpretation on 11/22/2017 at 4:22 am to Dr. Marjean Donna, who verbally acknowledged these results. Electronically Signed   By: Garald Balding M.D.   On: 11/22/2017 04:24   [place] Scrotom W/doppler  Result Date: 11/22/2017 CLINICAL DATA:  Acute onset of bilateral scrotal swelling. EXAM: SCROTAL ULTRASOUND DOPPLER ULTRASOUND OF THE TESTICLES TECHNIQUE: Complete ultrasound examination of the testicles, epididymis, and other scrotal structures was performed. Color and spectral Doppler ultrasound were also utilized to evaluate blood flow to the testicles. COMPARISON:  None. FINDINGS: Right testicle Measurements: 4.1 x 2.3 x 2.6 cm. No mass or microlithiasis visualized. Left testicle Measurements: 4.6 x 3.5 x 3.5 cm. No mass visualized. Diffusely heterogeneous in appearance, and asymmetrically enlarged, with underlying hyperemia, raising concern for orchitis. A single small calcification is noted within the left testis. Right epididymis:  A small 0.4 cm epididymal head cyst is noted. Left epididymis: Asymmetrically enlarged, with hyperemia, concerning for epididymitis. Hydrocele: Complex fluid with diffuse internal echoes is noted surrounding the left testis and epididymis, concerning for small pyocele. Varicocele:  None visualized. Pulsed Doppler interrogation of both testes demonstrates normal low resistance  arterial and venous waveforms bilaterally. There is asymmetric scrotal wall thickening on the left. IMPRESSION: 1. No evidence of testicular torsion. 2. Left-sided epididymo-orchitis, with small left-sided pyocele. No gas is seen at this time. 3. Left-sided scrotal wall thickening reflects overlying cellulitis. These results were called by  telephone at the time of interpretation on 11/22/2017 at 3:46 am to Dr. Marjean Donna , who verbally acknowledged these results. Electronically Signed   By: Garald Balding M.D.   On: 11/22/2017 03:49    Review of Systems  Constitutional: Negative for chills and fever.  HENT: Negative for sore throat and tinnitus.   Eyes: Negative for blurred vision and redness.  Respiratory: Negative for cough and shortness of breath.   Cardiovascular: Negative for chest pain, palpitations, orthopnea and PND.  Gastrointestinal: Negative for abdominal pain, diarrhea, nausea and vomiting.  Genitourinary: Negative for dysuria, frequency and urgency.  Musculoskeletal: Negative for joint pain and myalgias.  Skin: Negative for rash.       No lesions  Neurological: Negative for speech change, focal weakness and weakness.  Endo/Heme/Allergies: Does not bruise/bleed easily.       No temperature intolerance  Psychiatric/Behavioral: Negative for depression and suicidal ideas.    Blood pressure (!) 94/58, pulse (!) 101, temperature 99.1 F (37.3 C), temperature source Oral, resp. rate (!) 22, height _0  (1.854 m), weight 65.8 kg (145 lb), SpO2 96 %. Physical Exam  Vitals reviewed. Constitutional: He is oriented to person, place, and time. He appears well-developed and well-nourished. No distress.  HENT:  Head: Normocephalic and atraumatic.  Mouth/Throat: Oropharynx is clear and moist.  Eyes: Conjunctivae and EOM are normal. Pupils are equal, round, and reactive to light. No scleral icterus.  Neck: Normal range of motion. Neck supple. No JVD present. No tracheal deviation present.  No thyromegaly present.  Cardiovascular: Normal rate, regular rhythm and normal heart sounds.  Respiratory: Effort normal and breath sounds normal. No respiratory distress.  GI: Soft. Bowel sounds are normal. He exhibits no distension. There is no tenderness.  Genitourinary:  Genitourinary Comments: Deferred  Musculoskeletal: Normal range of motion. He exhibits no edema.  Lymphadenopathy:    He has no cervical adenopathy.  Neurological: He is alert and oriented to person, place, and time. No cranial nerve deficit.  Skin: Skin is warm and dry. No rash noted. No erythema.  Psychiatric: He has a normal mood and affect. His behavior is normal. Judgment and thought content normal.     Assessment/Plan This is a 37 year old male admitted for sepsis. 1.  Sepsis: The patient meets criteria via tachycardia, intermittent tachypnea and leukocytosis.  He is hemodynamically stable.  Continue broad-spectrum antibiotics.  Follow blood cultures for growth and sensitivities. 2.  Epididymitis: With orchitis; accompanying pyocele.  Urology states no need for drainage.  Continue Vanco mycin and Zosyn. 3.  Paraplegia: Treating to lability in blood pressure and heart rate.  Shift bed position frequently to avoid pressure ulcers. 4.  DVT prophylaxis: Lovenox 5.  GI prophylaxis: None The patient is a full code.  Time spent on admission orders and patient care approximately 45 minutes  Harrie Foreman, MD 11/22/2017, 7:57 AM

## 2017-11-22 NOTE — ED Triage Notes (Signed)
Patient coming from home with swelling of left testicle with tightness and running fever at home with highest temp of 101 after tylenol. Hx of blood clots in groin.

## 2017-11-22 NOTE — ED Notes (Signed)
Dr Diamond at bedside. 

## 2017-11-23 DIAGNOSIS — N5089 Other specified disorders of the male genital organs: Secondary | ICD-10-CM

## 2017-11-23 DIAGNOSIS — A419 Sepsis, unspecified organism: Principal | ICD-10-CM

## 2017-11-23 DIAGNOSIS — N453 Epididymo-orchitis: Secondary | ICD-10-CM

## 2017-11-23 LAB — URINE CULTURE: CULTURE: NO GROWTH

## 2017-11-23 LAB — CREATININE, SERUM
Creatinine, Ser: 0.6 mg/dL — ABNORMAL LOW (ref 0.61–1.24)
GFR calc Af Amer: >60 mL/min (ref >60)

## 2017-11-23 LAB — VANCOMYCIN, TROUGH: VANCOMYCIN TR: 11 ug/mL — AB (ref 15–20)

## 2017-11-23 MED ORDER — VANCOMYCIN HCL 10 G IV SOLR
1250.0000 mg | Freq: Three times a day (TID) | INTRAVENOUS | Status: DC
  Administered 2017-11-23: 1250 mg via INTRAVENOUS
  Filled 2017-11-23 (×3): qty 1250

## 2017-11-23 MED ORDER — LEVOFLOXACIN IN D5W 500 MG/100ML IV SOLN
500.0000 mg | INTRAVENOUS | Status: DC
  Filled 2017-11-23: qty 100

## 2017-11-23 MED ORDER — LEVOFLOXACIN 500 MG PO TABS
500.0000 mg | ORAL_TABLET | Freq: Every day | ORAL | Status: DC
  Administered 2017-11-23: 500 mg via ORAL
  Filled 2017-11-23: qty 1

## 2017-11-23 NOTE — Progress Notes (Signed)
Urology Consult Follow Up  Subjective: Afebrile.  Left scrotal swelling, skin changes improving per patient.  Continues to wear a condom cath and self cath 3 times daily.  Anti-infectives: Anti-infectives (From admission, onward)   Start     Dose/Rate Route Frequency Ordered Stop   11/23/17 0800  vancomycin (VANCOCIN) 1,250 mg in sodium chloride 0.9 % 250 mL IVPB     1,250 mg 166.7 mL/hr over 90 Minutes Intravenous Every 8 hours 11/23/17 0735     11/22/17 0900  piperacillin-tazobactam (ZOSYN) IVPB 3.375 g     3.375 g 12.5 mL/hr over 240 Minutes Intravenous Every 8 hours 11/22/17 0600     11/22/17 0700  vancomycin (VANCOCIN) IVPB 1000 mg/200 mL premix  Status:  Discontinued     1,000 mg 200 mL/hr over 60 Minutes Intravenous Every 8 hours 11/22/17 0620 11/23/17 0735   11/22/17 0115  piperacillin-tazobactam (ZOSYN) IVPB 3.375 g     3.375 g 100 mL/hr over 30 Minutes Intravenous  Once 11/22/17 0107 11/22/17 0148   11/22/17 0115  vancomycin (VANCOCIN) IVPB 1000 mg/200 mL premix     1,000 mg 200 mL/hr over 60 Minutes Intravenous  Once 11/22/17 0107 11/22/17 0219      Current Facility-Administered Medications  Medication Dose Route Frequency Provider Last Rate Last Dose  . 0.9 %  sodium chloride infusion   Intravenous Continuous Harrie Foreman, MD 125 mL/hr at 11/22/17 1546    . acetaminophen (TYLENOL) tablet 650 mg  650 mg Oral Q6H PRN Harrie Foreman, MD   650 mg at 11/23/17 8676   Or  . acetaminophen (TYLENOL) suppository 650 mg  650 mg Rectal Q6H PRN Harrie Foreman, MD      . apixaban [facility]) tablet 5 mg  5 mg Oral BID Harrie Foreman, MD   5 mg at 11/23/17 0817  . baclofen (LIORESAL) tablet 25 mg  25 mg Oral QID Demetrios Loll, MD   25 mg at 11/23/17 0817  . diazepam (VALIUM) tablet 2.5 mg  2.5 mg Oral QHS PRN Harrie Foreman, MD      . docusate sodium (COLACE) capsule 100 mg  100 mg Oral BID Harrie Foreman, MD   100 mg at 11/22/17 1038  . finasteride (PROPECIA)  tablet 1 mg  1 mg Oral Daily Harrie Foreman, MD      . Influenza vac split quadrivalent PF (FLUARIX) injection 0.5 mL  0.5 mL Intramuscular Tomorrow-1000 Harrie Foreman, MD      . ondansetron [facility]) tablet 4 mg  4 mg Oral Q6H PRN Harrie Foreman, MD       Or  . ondansetron [facility]) injection 4 mg  4 mg Intravenous Q6H PRN Harrie Foreman, MD      . piperacillin-tazobactam (ZOSYN) IVPB 3.375 g  3.375 g Intravenous Q8H Harrie Foreman, MD 12.5 mL/hr at 11/23/17 0817 3.375 g at 11/23/17 0817  . senna-docusate (Senokot-S) tablet 2 tablet  2 tablet Oral Daily Harrie Foreman, MD   2 tablet at 11/22/17 1038  . tamsulosin (FLOMAX) capsule 0.4 mg  0.4 mg Oral Daily Harrie Foreman, MD   0.4 mg at 11/22/17 2102  . vancomycin (VANCOCIN) 1,250 mg in sodium chloride 0.9 % 250 mL IVPB  1,250 mg Intravenous Q8H Maccia, Melissa D, RPH 166.7 mL/hr at 11/23/17 0818 1,250 mg at 11/23/17 0818     Objective: Vital signs in last 24 hours: Temp:  [98.4 F (36.9 C)-100.1 F (37.8 C)] 100.1  F (37.8 C) (01/03 0710) Pulse Rate:  [85-118] 118 (01/03 0710) Resp:  [16-18] 16 (01/03 0710) BP: (91-121)/(48-75) 106/62 (01/03 0710) SpO2:  [97 %-100 %] 98 % (01/03 0710)  Intake/Output from previous day: 01/02 0701 - 01/03 0700 In: 1745.8 [I.V.:1245.8; IV Piggyback:500] Out: 2150 [Urine:2150] Intake/Output this shift: No intake/output data recorded.   Physical Exam  Alert and oriented x3.  No acute distress.  Mother at bedside. No respiratory distress or increased work of breathing Moving upper extremities but flaccid lower extremities Muscle atrophy appreciated Circumcised phallus with condom cath in place draining clear yellow urine Left testicle enlarged, firm without significant overlying erythema or skin thickening.  Right testicle normal.  No crepitus or fluctuance.  Lab Results:  Recent Labs    11/22/17 0055  WBC 15.7*  HGB 14.1  HCT 41.9  PLT 194   BMET Recent Labs     11/22/17 0055 11/23/17 0641  NA 136  --   K 3.5  --   CL 102  --   CO2 24  --   GLUCOSE 113*  --   BUN 10  --   CREATININE 0.58* 0.60*  CALCIUM 9.3  --    PT/INR No results for input(s): LABPROT, INR in the last 72 hours. ABG No results for input(s): PHART, HCO3 in the last 72 hours.  Invalid input(s): PCO2, PO2  Studies/Results: US Venous Img Lower Bilateral  Result Date: 11/22/2017 CLINICAL DATA:  Acute onset of bilateral upper leg erythema. Personal history of deep venous thrombosis. EXAM: BILATERAL LOWER EXTREMITY VENOUS DOPPLER ULTRASOUND TECHNIQUE: Gray-scale sonography with graded compression, as well as color Doppler and duplex ultrasound were performed to evaluate the lower extremity deep venous systems from the level of the common femoral vein and including the common femoral, femoral, profunda femoral, popliteal and calf veins including the posterior tibial, peroneal and gastrocnemius veins when visible. The superficial great saphenous vein was also interrogated. Spectral Doppler was utilized to evaluate flow at rest and with distal augmentation maneuvers in the common femoral, femoral and popliteal veins. COMPARISON:  None. FINDINGS: RIGHT LOWER EXTREMITY Common Femoral Vein: There appears to be chronic nonocclusive thrombus within the common femoral vein. Saphenofemoral Junction: There appears to be chronic nonocclusive thrombus at the saphenofemoral junction. Profunda Femoral Vein: No evidence of thrombus. Normal compressibility and flow on color Doppler imaging. Femoral Vein: There appears to be chronic nonocclusive thrombus at the proximal femoral vein. Popliteal Vein: No evidence of thrombus. Normal compressibility, respiratory phasicity and response to augmentation. Calf Veins: No evidence of thrombus. Normal compressibility and flow on color Doppler imaging. Superficial Great Saphenous Vein: No evidence of thrombus. Normal compressibility. Venous Reflux:  None. Other Findings:   None. LEFT LOWER EXTREMITY Common Femoral Vein: There appears to be chronic nonocclusive thrombus at the left common femoral vein. Saphenofemoral Junction: No evidence of thrombus. Normal compressibility and flow on color Doppler imaging. Profunda Femoral Vein: No evidence of thrombus. Normal compressibility and flow on color Doppler imaging. Femoral Vein: No evidence of thrombus. Normal compressibility, respiratory phasicity and response to augmentation. Popliteal Vein: No evidence of thrombus. Normal compressibility, respiratory phasicity and response to augmentation. Calf Veins: No evidence of thrombus. Normal compressibility and flow on color Doppler imaging. Superficial Great Saphenous Vein: No evidence of thrombus. Normal compressibility. Venous Reflux:  None. Other Findings:  None. 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 These  results were called by telephone at the time of interpretation on 11/22/2017 at 4:22 am to Dr. Marjean Donna, who verbally acknowledged these results. Electronically Signed   By: Garald Balding M.D.   On: 11/22/2017 04:24   [place] Scrotom W/doppler  Result Date: 11/22/2017 CLINICAL DATA:  Acute onset of bilateral scrotal swelling. EXAM: SCROTAL ULTRASOUND DOPPLER ULTRASOUND OF THE TESTICLES TECHNIQUE: Complete ultrasound examination of the testicles, epididymis, and other scrotal structures was performed. Color and spectral Doppler ultrasound were also utilized to evaluate blood flow to the testicles. COMPARISON:  None. FINDINGS: Right testicle Measurements: 4.1 x 2.3 x 2.6 cm. No mass or microlithiasis visualized. Left testicle Measurements: 4.6 x 3.5 x 3.5 cm. No mass visualized. Diffusely heterogeneous in appearance, and asymmetrically enlarged, with underlying hyperemia, raising concern for orchitis. A single small calcification is noted  within the left testis. Right epididymis:  A small 0.4 cm epididymal head cyst is noted. Left epididymis: Asymmetrically enlarged, with hyperemia, concerning for epididymitis. Hydrocele: Complex fluid with diffuse internal echoes is noted surrounding the left testis and epididymis, concerning for small pyocele. Varicocele:  None visualized. Pulsed Doppler interrogation of both testes demonstrates normal low resistance arterial and venous waveforms bilaterally. There is asymmetric scrotal wall thickening on the left. IMPRESSION: 1. No evidence of testicular torsion. 2. Left-sided epididymo-orchitis, with small left-sided pyocele. No gas is seen at this time. 3. Left-sided scrotal wall thickening reflects overlying cellulitis. These results were called by telephone at the time of interpretation on 11/22/2017 at 3:46 am to Dr. Marjean Donna , who verbally acknowledged these results. Electronically Signed   By: Garald Balding M.D.   On: 11/22/2017 03:49     Assessment: 37 year old male with  paraplegia/neurogenic bladder admitted with left epididymal orchitis.  Improving on IV Zosyn/vancomycin remains afebrile.  Urine culture negative to date.  Plan: -Transition to oral antibiotics, would recommend complete 2-week course -Continue bladder management as per previous, condom cath with CIC 3 times daily -Scrotal support discussed and reviewed  Given improvement, urology will sign off.  Patient advised to follow-up with Dr. Tresa Moore in several weeks following discharge.     LOS: 1 day    Matthew Davenport 11/23/2017

## 2017-11-23 NOTE — Progress Notes (Signed)
[place] [facility] [place] NAME: Matthew Davenport    MR#:  [id number]  DATE OF BIRTH:  16-Dec-1980  SUBJECTIVE:  CHIEF COMPLAINT:  No chief complaint on file.  Better scrotal swelling. REVIEW OF SYSTEMS:  Review of Systems  Constitutional: Negative for chills, fever and malaise/fatigue.  HENT: Negative for sore throat.   Eyes: Negative for blurred vision and double vision.  Respiratory: Negative for cough, hemoptysis, shortness of breath, wheezing and stridor.   Cardiovascular: Negative for chest pain, palpitations, orthopnea and leg swelling.  Gastrointestinal: Negative for abdominal pain, blood in stool, diarrhea, melena, nausea and vomiting.  Genitourinary: Negative for dysuria, flank pain and hematuria.       Scrotal swelling.  Musculoskeletal: Negative for back pain and joint pain.  Neurological: Negative for dizziness, sensory change, focal weakness, seizures, loss of consciousness, weakness and headaches.  Endo/Heme/Allergies: Negative for polydipsia.  Psychiatric/Behavioral: Negative for depression. The patient is not nervous/anxious.     DRUG ALLERGIES:   Allergies  Allergen Reactions  . Banana Nausea Only    Stomach Cramps   . Ferrous Gluconate Nausea And Vomiting   VITALS:  Blood pressure 106/62, pulse (!) 118, temperature 100.1 F (37.8 C), temperature source Oral, resp. rate 16, height 6\' 1"  (1.854 m), weight 145 lb (65.8 kg), SpO2 98 %. PHYSICAL EXAMINATION:  Physical Exam  Constitutional: He is oriented to person, place, and time and well-developed, well-nourished, and in no distress.  HENT:  Head: Normocephalic.  Mouth/Throat: Oropharynx is clear and moist.  Eyes: Conjunctivae and EOM are normal. Pupils are equal, round, and reactive to light. No scleral icterus.  Neck: Normal range of motion. Neck supple. No JVD present. No tracheal deviation present.  Cardiovascular: Normal rate, regular rhythm and normal heart  sounds. Exam reveals no gallop.  No murmur heard. Pulmonary/Chest: Effort normal and breath sounds normal. No respiratory distress. He has no wheezes. He has no rales.  Abdominal: Soft. Bowel sounds are normal. He exhibits no distension. There is no tenderness. There is no rebound.  Genitourinary: Penis normal. No discharge found.  Genitourinary Comments: Better scrotal swelling.  Musculoskeletal: He exhibits no edema or tenderness.  Neurological: He is alert and oriented to person, place, and time. No cranial nerve deficit.  Paraplegia.  Skin: No rash noted. No erythema.  Psychiatric: Affect normal.   LABORATORY PANEL:  Male CBC Recent Labs  Lab 11/22/17 0055  WBC 15.7*  HGB 14.1  HCT 41.9  PLT 194   ------------------------------------------------------------------------------------------------------------------ Chemistries  Recent Labs  Lab 11/22/17 0055 11/23/17 0641  NA 136  --   K 3.5  --   CL 102  --   CO2 24  --   GLUCOSE 113*  --   BUN 10  --   CREATININE 0.58* 0.60*  CALCIUM 9.3  --   AST 25  --   ALT 16*  --   ALKPHOS 42  --   BILITOT 1.2  --    RADIOLOGY:  No results found. ASSESSMENT AND PLAN:   This is a 37 year old male admitted for sepsis. 1.  Sepsis due to Epididymitis and orchitis; accompanying pyocele.  Urology states no need for drainage.  Continue Vanco mycin and Zosyn, Transition to oral antibiotics, would recommend complete 2-week course and Continue bladder management as per previous, condom cath with CIC 3 times daily per Dr. Erlene Quan. Follow-up CBC and blood culture.  2.  Paraplegia: Shift bed position frequently to avoid pressure ulcers.  All the records are reviewed and case discussed with Care Management/Social Worker. Management plans discussed with the patient, his mother and they are in agreement.  CODE STATUS: Full Code  TOTAL TIME TAKING CARE OF THIS PATIENT: 28 minutes.   More than 50% of the time was spent in  counseling/coordination of care: YES  POSSIBLE D/C IN 1-2 DAYS, DEPENDING ON CLINICAL CONDITION.   Demetrios Loll M.D on 11/23/2017 at 1:36 PM  Between 7am to 6pm - Pager - [phone]  After 6pm go to [email or web address] - [facility]

## 2017-11-23 NOTE — Progress Notes (Addendum)
Pharmacy Antibiotic Note  Matthew Davenport is a 37 y.o. male admitted on 11/22/2017 with sepsis s/t to swollen testicle.  Pharmacy has been consulted for vanc/zosyn dosing.  Plan: Vanc tough resulted at 45. Will increase dose to 1250mg  q 8 hours Goal trough 15-20 Trough prior to the 4th new dose 1/4 @ 1530 Continue zosyn 3.375g q 8 hr EI infusion  Height: 6\' 1"  (185.4 cm) Weight: 145 lb (65.8 kg) IBW/kg (Calculated) : 79.9  Temp (24hrs), Avg:99 F (37.2 C), Min:98.4 F (36.9 C), Max:100.1 F (37.8 C)  Recent Labs  Lab 11/22/17 0055 11/23/17 0641  WBC 15.7*  --   CREATININE 0.58* 0.60*  LATICACIDVEN 1.0  --   VANCOTROUGH  --  11*    Estimated Creatinine Clearance: 121.1 mL/min (A) (by C-G formula based on SCr of 0.6 mg/dL (L)).    Allergies  Allergen Reactions  . Banana Nausea Only    Stomach Cramps   . Ferrous Gluconate Nausea And Vomiting    Thank you for allowing pharmacy to be a part of this patient's care.  Ramond Dial, Pharm.D, BCPS Clinical Pharmacist  11/23/2017

## 2017-11-24 LAB — CBC
HCT: 34.3 % — ABNORMAL LOW (ref 40.0–52.0)
Hemoglobin: 11.2 g/dL — ABNORMAL LOW (ref 13.0–18.0)
MCH: 28.3 pg (ref 26.0–34.0)
MCHC: 32.6 g/dL (ref 32.0–36.0)
MCV: 86.8 fL (ref 80.0–100.0)
Platelets: 150 10*3/uL (ref 150–440)
RBC: 3.95 MIL/uL — ABNORMAL LOW (ref 4.40–5.90)
RDW: 14.7 % — AB (ref 11.5–14.5)
WBC: 8.4 10*3/uL (ref 3.8–10.6)

## 2017-11-24 MED ORDER — MELATONIN 5 MG PO TABS
5.0000 mg | ORAL_TABLET | Freq: Every day | ORAL | Status: DC
  Administered 2017-11-24: 5 mg via ORAL
  Filled 2017-11-24 (×2): qty 1

## 2017-11-24 MED ORDER — LEVOFLOXACIN 500 MG PO TABS: 500.0000 mg | ORAL_TABLET | Freq: Every day | ORAL | 0 refills | Status: DC

## 2017-11-24 NOTE — Discharge Instructions (Signed)
Continue bladder management as per previous, condom cath with CIC 3 times daily  Information on my medicine - ELIQUIS (apixaban)  Why was Eliquis prescribed for you? Eliquis was prescribed for you to reduce the risk of forming blood clots that can cause a stroke if you have a medical condition called atrial fibrillation (a type of irregular heartbeat) OR to reduce the risk of a blood clots   What do You need to know about Eliquis ? Take your Eliquis TWICE DAILY - one tablet in the morning and one tablet in the evening with or without food.  It would be best to take the doses about the same time each day.  If you have difficulty swallowing the tablet whole please discuss with your pharmacist how to take the medication safely.  Take Eliquis exactly as prescribed by your doctor and DO NOT stop taking Eliquis without talking to the doctor who prescribed the medication.  Stopping may increase your risk of developing a new clot or stroke.  Refill your prescription before you run out.  After discharge, you should have regular check-up appointments with your healthcare provider that is prescribing your Eliquis.  In the future your dose may need to be changed if your kidney function or weight changes by a significant amount or as you get older.  What do you do if you miss a dose? If you miss a dose, take it as soon as you remember on the same day and resume taking twice daily.  Do not take more than one dose of ELIQUIS at the same time.  Important Safety Information A possible side effect of Eliquis is bleeding. You should call your healthcare provider right away if you experience any of the following: ? Bleeding from an injury or your nose that does not stop. ? Unusual colored urine (red or dark brown) or unusual colored stools (red or black). ? Unusual bruising for unknown reasons. ? A serious fall or if you hit your head (even if there is no bleeding).  Some medicines may interact with  Eliquis and might increase your risk of bleeding or clotting while on Eliquis. To help avoid this, consult your healthcare provider or pharmacist prior to using any new prescription or non-prescription medications, including herbals, vitamins, non-steroidal anti-inflammatory drugs (NSAIDs) and supplements.  This website has more information on Eliquis (apixaban): [email or web address].

## 2017-11-24 NOTE — Discharge Summary (Signed)
[facility] NAME: Matthew Davenport    MR#:  [id number]  DATE OF BIRTH:  [birth year]  DATE OF ADMISSION:  11/22/2017   ADMITTING PHYSICIAN: Harrie Foreman, MD  DATE OF DISCHARGE: 11/24/2017  2:27 PM  PRIMARY CARE PHYSICIAN: Adin Hector, MD   ADMISSION DIAGNOSIS:  Epididymoorchitis [N45.3] Testicular swelling, left [N50.89] Sepsis, due to unspecified organism ([place]) [A41.9] DISCHARGE DIAGNOSIS:  Active Problems:   Sepsis ([place])  SECONDARY DIAGNOSIS:   Past Medical History:  Diagnosis Date  . Tetraplegia [facility]) 2005   HOSPITAL COURSE:  This is a 37 year old male admitted for sepsis. 1. Sepsis due to Epididymitis and orchitis; accompanying pyocele. Urology states no need for drainage. He was treated with Vanco mycin and Zosyn, Transition to oral antibiotics, would recommend complete 2-week course and Continue bladder management as per previous, condom cath with CIC 3 times daily per Dr. Erlene Quan. Leukocytosis improved.  Blood culture is negative so far.  Changed to Levaquin for 12 more days..  2. Paraplegia:Shift bed position frequently to avoid pressure ulcers.  DISCHARGE CONDITIONS:  Stable, discharged to home today. CONSULTS OBTAINED:  Treatment Team:  Festus Aloe, MD DRUG ALLERGIES:   Allergies  Allergen Reactions  . Banana Nausea Only    Stomach Cramps   . Ferrous Gluconate Nausea And Vomiting   DISCHARGE MEDICATIONS:   Allergies as of 11/24/2017      Reactions   Banana Nausea Only   Stomach Cramps   Ferrous Gluconate Nausea And Vomiting      Medication List    TAKE these medications   baclofen 10 MG tablet Commonly known as:  LIORESAL Take 25 mg by mouth 4 (four) times daily.   diazepam 5 MG tablet Commonly known as:  VALIUM Take 2.5 mg by mouth at bedtime as needed for muscle spasms.   ELIQUIS 5 MG Tabs tablet Generic drug:  apixaban Take 5 mg by mouth 2 (two) times daily.     finasteride 1 MG tablet Commonly known as:  PROPECIA Take 1 mg by mouth daily.   levofloxacin 500 MG tablet Commonly known as:  LEVAQUIN Take 1 tablet (500 mg total) by mouth daily at 6 PM.   senna-docusate 8.6-50 MG tablet Commonly known as:  Senokot-S Take 2 tablets by mouth daily.   tamsulosin 0.4 MG Caps capsule Commonly known as:  FLOMAX Take 0.4 mg by mouth daily.        DISCHARGE INSTRUCTIONS:  SEE AVS.  If you experience worsening of your admission symptoms, develop shortness of breath, life threatening emergency, suicidal or homicidal thoughts you must seek medical attention immediately by calling 911 or calling your MD immediately  if symptoms less severe.  You Must read complete instructions/literature along with all the possible adverse reactions/side effects for all the Medicines you take and that have been prescribed to you. Take any new Medicines after you have completely understood and accpet all the possible adverse reactions/side effects.   Please note  You were cared for by a hospitalist during your hospital stay. If you have any questions about your discharge medications or the care you received while you were in the hospital after you are discharged, you can call the unit and asked to speak with the hospitalist on call if the hospitalist that took care of you is not available. Once you are discharged, your primary care physician will handle any further medical issues. Please note that NO REFILLS for any  discharge medications will be authorized once you are discharged, as it is imperative that you return to your primary care physician (or establish a relationship with a primary care physician if you do not have one) for your aftercare needs so that they can reassess your need for medications and monitor your lab values.    On the day of Discharge:  VITAL SIGNS:  Blood pressure 108/75, pulse 79, temperature 98.8 F (37.1 C), temperature source Oral, resp. rate  16, height 6\' 1"  (1.854 m), weight 151 lb 1.6 oz (68.5 kg), SpO2 97 %. PHYSICAL EXAMINATION:  GENERAL:  37 y.o.-year-old patient lying in the bed with no acute distress.  EYES: Pupils equal, round, reactive to light and accommodation. No scleral icterus. Extraocular muscles intact.  HEENT: Head atraumatic, normocephalic. Oropharynx and nasopharynx clear.  NECK:  Supple, no jugular venous distention. No thyroid enlargement, no tenderness.  LUNGS: Normal breath sounds bilaterally, no wheezing, rales,rhonchi or crepitation. No use of accessory muscles of respiration.  CARDIOVASCULAR: S1, S2 normal. No murmurs, rubs, or gallops.  ABDOMEN: Soft, non-tender, non-distended. Bowel sounds present. No organomegaly or mass.  Much better scrotal swelling and improved erythema. EXTREMITIES: No pedal edema, cyanosis, or clubbing.  NEUROLOGIC: Cranial nerves II through XII are intact.  Paraplegia. PSYCHIATRIC: The patient is alert and oriented x 3.  SKIN: No obvious rash, lesion, or ulcer.  DATA REVIEW:   CBC Recent Labs  Lab 11/24/17 0426  WBC 8.4  HGB 11.2*  HCT 34.3*  PLT 150    Chemistries  Recent Labs  Lab 11/22/17 0055 11/23/17 0641  NA 136  --   K 3.5  --   CL 102  --   CO2 24  --   GLUCOSE 113*  --   BUN 10  --   CREATININE 0.58* 0.60*  CALCIUM 9.3  --   AST 25  --   ALT 16*  --   ALKPHOS 42  --   BILITOT 1.2  --      Microbiology Results  Results for orders placed or performed during the hospital encounter of 11/22/17  Blood Culture (routine x 2)     Status: None (Preliminary result)   Collection Time: 11/22/17 12:55 AM  Result Value Ref Range Status   Specimen Description BLOOD RT HAND  Final   Special Requests   Final    BOTTLES DRAWN AEROBIC AND ANAEROBIC Blood Culture results may not be optimal due to an excessive volume of blood received in culture bottles   Culture   Final    NO GROWTH 2 DAYS Performed at [facility], [address] [place],  [place] [address]    Report Status PENDING  Incomplete  Blood Culture (routine x 2)     Status: None (Preliminary result)   Collection Time: 11/22/17 12:55 AM  Result Value Ref Range Status   Specimen Description BLOOD LT HAND  Final   Special Requests   Final    BOTTLES DRAWN AEROBIC AND ANAEROBIC Blood Culture results may not be optimal due to an excessive volume of blood received in culture bottles   Culture   Final    NO GROWTH 2 DAYS Performed at [facility], [address] [place], [place] [address]    Report Status PENDING  Incomplete  Urine culture     Status: None   Collection Time: 11/22/17  3:55 AM  Result Value Ref Range Status   Specimen Description   Final    URINE,  RANDOM Performed at [facility], [address], [place], [place] [address]    Special Requests   Final    NONE Performed at [facility], [address], [place], [place] [address]    Culture   Final    NO GROWTH Performed at [facility], [place] [address], [place], [place] [address]    Report Status 11/23/2017 FINAL  Final    RADIOLOGY:  No results found.   Management plans discussed with the patient, family and they are in agreement.  CODE STATUS: Prior   TOTAL TIME TAKING CARE OF THIS PATIENT: 32 minutes.    Demetrios Loll M.D on 11/24/2017 at 6:04 PM  Between 7am to 6pm - Pager - [phone]  After 6pm go to [email or web address] - password [email or web address] [facility]  [facility]  Office  [phone]  CC: Primary care physician; Adin Hector, MD   Note: This dictation was prepared with Dragon dictation along with smaller phrase technology. Any transcriptional errors that result from this process are unintentional.

## 2017-11-27 LAB — CULTURE, BLOOD (ROUTINE X 2)
CULTURE: NO GROWTH
Culture: NO GROWTH

## 2018-06-20 DIAGNOSIS — Z86018 Personal history of other benign neoplasm: Secondary | ICD-10-CM

## 2018-06-20 HISTORY — DX: Personal history of other benign neoplasm: Z86.018

## 2020-05-28 ENCOUNTER — Encounter: Payer: Self-pay | Admitting: Physical Therapy

## 2020-05-28 ENCOUNTER — Ambulatory Visit: Payer: BC Managed Care – PPO | Attending: Internal Medicine | Admitting: Physical Therapy

## 2020-05-28 ENCOUNTER — Other Ambulatory Visit: Payer: Self-pay

## 2020-05-28 DIAGNOSIS — R293 Abnormal posture: Secondary | ICD-10-CM

## 2020-05-28 DIAGNOSIS — R208 Other disturbances of skin sensation: Secondary | ICD-10-CM

## 2020-05-28 DIAGNOSIS — R209 Unspecified disturbances of skin sensation: Secondary | ICD-10-CM | POA: Diagnosis present

## 2020-05-28 DIAGNOSIS — G8253 Quadriplegia, C5-C7 complete: Secondary | ICD-10-CM | POA: Diagnosis present

## 2020-05-28 NOTE — Therapy (Addendum)
[facility] [address] [place], [place], [address] Phone: [phone]   Fax:  [phone]  Physical Therapy Evaluation  Patient Details  Name: Matthew Davenport MRN: [id number] Date of Birth: Aug 16, 1981 Referring Provider (PT): Lorelee Market, MD   Encounter Date: 05/28/2020   PT End of Session - 05/28/20 1255    Visit Number 1    Number of Visits 1    Date for PT Re-Evaluation 05/28/20    Authorization Type State BCBS and Medicaid    PT Start Time 1115    PT Stop Time 1230    PT Time Calculation (min) 75 min    Activity Tolerance Patient tolerated treatment well    Behavior During Therapy [facility] for tasks assessed/performed           Past Medical History:  Diagnosis Date  . Tetraplegia ([place]) 2005    Past Surgical History:  Procedure Laterality Date  . SPINAL FUSION  2005  . WISDOM TOOTH EXTRACTION  2000    There were no vitals filed for this visit.    Subjective Assessment - 05/28/20 1248    Subjective Pt referred for new power wheelchair assessment, new shower chair assessment and new air inflation mattress replacement.  Pt's current power w/c is >18 years old and roll in shower chair was donated by [facility] [facility] in 2005.    Patient is accompained by: Family member    Pertinent History tetraplegia, spinal fusion    Currently in Pain? No/denies              [facility] PT Assessment - 05/28/20 1250      Assessment   Medical Diagnosis tetraplegia, power w/c evaluation    Referring Provider (PT) Lorelee Market, MD    Onset Date/Surgical Date 03/25/20    Prior [place] after injury      Balance Screen   Has the patient fallen in the past 6 months No      Prior Function   Level of Independence Independent with household mobility with device;Independent with community mobility with device;Needs assistance with ADLs;Needs assistance with transfers                Mobility/Seating Evaluation    PATIENT INFORMATION: Name: Matthew Davenport DOB: 1981-09-22  Sex: Male Date seen: 05/28/2020 Time: 11:09  Address:  [address]  [place] [address] Physician: Lorelee Market, MD This evaluation/justification form will serve as the LMN for the following suppliers: __________________________ Supplier: [employer] Contact Person: Deberah Pelton, ATP Phone:  ?????   Seating Therapist: Misty Stanley, PT, DPT Phone:   [phone]   Phone: [phone] (Home)  [phone] (Mobile)     Spouse/Parent/Caregiver name: Mother - Matthew Davenport  Phone number: [phone] Insurance/Payer: [place] and Medicaid     Reason for Referral: New power wheelchair; new roll in shower chair  Patient/Caregiver Goals: To maintain independent mobility in power wheelchair and continue to use roll in shower chair for hygiene/bathing  Patient was seen for face-to-face evaluation for new power wheelchair.  Also present was ATP and mother  to discuss recommendations and wheelchair options.  Further paperwork was completed and sent to vendor.  Patient appears to qualify for power mobility device at this time per objective findings.   MEDICAL HISTORY: Diagnosis: Primary Diagnosis: C6 level Tetraplegia Onset: 2005 Diagnosis: ?????   '[]'$ Progressive Disease Relevant past and future surgeries: cervical spine fusion   Height: 6'1" Weight: 145lb Explain recent  changes or trends in weight: none   History including Falls: No falls; PMH includes - anxiety, depression, anemia, HLD, sepsis, DVT    HOME ENVIRONMENT: '[x]'$ House  '[]'$ Condo/town home  '[]'$ Apartment  '[]'$ Assisted Living    '[]'$ Lives Alone '[x]'$  Lives with Others                                                                                          Hours with caregiver: CNA assists 38 hours a week + respite hours  '[x]'$ Home is accessible to patient           Stairs      '[]'$ Yes '[]'$  No     Ramp '[x]'$ Yes '[]'$ No Comments:  Patient [place]  [place] a farm and does go outside when parents feeding horses; House floor plan was designed and built around patient's disability; roll in showers, wide doors, hardwood floors   COMMUNITY ADL: TRANSPORTATION: '[]'$ Car    '[x]'$ Van    '[]'$ Public Transportation    '[]'$ Adapted w/c Lift    '[]'$ Ambulance    '[]'$ Other:       '[x]'$ Sits in wheelchair during transport  Employment/School: ????? 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  Functional Processing Skills for Wheeled Mobility '[x]'$ Processing Skills are adequate for safe wheelchair operation  Areas of concern than may interfere with safe operation of wheelchair Description of problem   '[]'$  Attention to environment      '[]'$ Judgment      '[]'$  Hearing  '[]'$  Vision or visual processing      '[]'$ Motor Planning  '[]'$  Fluctuations in Behavior  ?????    VERBAL COMMUNICATION: '[x]'$ WFL receptive '[x]'$  WFL expressive '[]'$ Understandable  '[]'$ Difficult to understand  '[]'$ non-communicative '[]'$  Uses an augmented communication device  CURRENT SEATING / MOBILITY: Current Mobility Base:  '[]'$ None '[]'$ Dependent '[]'$ Manual '[]'$ Scooter '[x]'$ Power  Type of Control: goal post joy stick  Manufacturer:  Permobil [id number]:  19 x 21 Age: 86-9 years  Current Condition of Mobility Base:  fair; back rest, arm rests and head rests have been replaced.  Seat elevator tower is loose, extra movement in  seat   Current Wheelchair components:  R goal post joy stick; stander, power recline, tilt, ELR, Jay cushion, G3 corpus back with lumbar support/contoured, seat belt, head rest, height adjustable flip back arm rests, knee bar for standing  Describe posture in present seating system:  posterior pelvic tilt with sacral sitting, trunk lateral flexion to R      SENSATION and SKIN ISSUES: Sensation '[]'$ Intact  '[]'$ Impaired '[x]'$ Absent   Level of sensation: C4-5 level  Pressure Relief: Able to perform effective pressure relief :    '[]'$ Yes  '[x]'$  No Method: boosts through elbow intermittently but unable to hold for more than a few seconds; has worn down arm rests and has caused skin breadown on elbows If not, Why?: tetraplegia; requires total A for pressure relief - power tilt and recline, air mattress on bed and mother  turns pt halfway through the night  Skin Issues/Skin Integrity Current Skin Issues  '[]'$ Yes '[x]'$ No '[x]'$ Intact '[]'$  Red area'[]'$  Open Area  '[]'$ Scar Tissue '[x]'$ At risk from prolonged sitting Where  ?????  History of Skin Issues  '[]'$ Yes '[x]'$ No Where  ????? When  ?????  Hx of skin flap surgeries  '[]'$ Yes '[x]'$ No Where  ????? When  ?????  Limited sitting tolerance '[]'$ Yes '[x]'$ No Hours spent sitting in wheelchair daily: 10 hours a day  Complaint of Pain:  Please describe: R wrist burning pain   Swelling/Edema: h/o edema due to DVT but pt on blood thinners now; IVC filter   ADL STATUS (in reference to wheelchair use):  Indep Assist Unable Indep with Equip Not assessed Comments  Dressing '[]'$  '[x]'$  '[]'$  '[]'$  '[]'$  bed level  Eating '[]'$  '[]'$  '[]'$  '[x]'$  '[]'$  w/c level   Toileting '[]'$  '[x]'$  '[]'$  '[]'$  '[]'$  catheter for urine, bowel program bed level every other day  Bathing '[]'$  '[x]'$  '[]'$  '[]'$  '[]'$  uses roll in shower chair, hand held shower head   Grooming/Hygiene '[]'$  '[]'$  '[]'$  '[x]'$  '[]'$  w/c level   Meal Prep '[]'$  '[]'$  '[x]'$  '[]'$  '[]'$  ?????  IADLS '[]'$  '[]'$  '[]'$  '[x]'$  '[]'$  does most on computer  Bowel Management: '[x]'$ Continent  '[]'$ Incontinent  '[]'$ Accidents Comments:  managed with bowel program   Bladder Management: '[x]'$ Continent  '[]'$ Incontinent  '[]'$ Accidents Comments:  managed with catheter     WHEELCHAIR SKILLS: Manual w/c Propulsion: '[]'$ UE or LE strength and endurance sufficient to participate in ADLs using manual wheelchair Arm : '[]'$ left '[]'$ right   '[]'$ Both      Distance: ????? Foot:  '[]'$ left '[]'$ right   '[]'$ Both  Operate Scooter: '[]'$  Strength, hand grip, balance and transfer appropriate for use '[]'$ Living  environment is accessible for use of scooter  Operate Power w/c:  '[x]'$  Std. Joystick   '[]'$  Alternative Controls Indep '[x]'$  Assist '[]'$  Dependent/unable '[]'$  N/A '[]'$   '[x]'$ Safe          '[x]'$  Functional      Distance: community distances  Bed confined without wheelchair '[x]'$  Yes '[]'$  No   STRENGTH/RANGE OF MOTION:  Active and Passive Range of Motion Strength  Shoulder Less than 120 deg shoulder flexion/abd 90 deg flexion/ABD 4/5 but unable to maintain without contralateral UE support for support; causes trunk lateral flexion and loss of balance  Elbow WFL bicep 4/5, tricep 0/5  Wrist/Hand WFL wrist extension 4-/5; wrist flexion 0/5  Hip WFL 0/5  Knee WFL 0/5  Ankle WFL 0/5     MOBILITY/BALANCE:  '[]'$  Patient is totally dependent for mobility  ?????    Balance Transfers Ambulation  Sitting Balance: Standing Balance: '[]'$  Independent '[]'$  Independent/Modified Independent  '[]'$  WFL     '[]'$  WFL '[]'$  Supervision '[]'$  Supervision  '[x]'$  Uses UE for balance  '[]'$  Supervision '[]'$  Min Assist '[]'$  Ambulates with Assist  ?????    '[]'$  Min Assist '[]'$  Min assist '[]'$  Mod Assist '[]'$  Ambulates with Device:      '[]'$  RW  '[]'$  StW  '[]'$  Cane  '[]'$  ?????  '[]'$  Mod Assist '[]'$  Mod assist '[]'$  Max assist   '[]'$  Max Assist '[]'$  Max assist '[x]'$  Dependent '[]'$  Indep. Short Distance Only  '[]'$  Unable '[x]'$  Unable '[x]'$  Lift / Sling Required Distance (in feet)  ?????   '[x]'$  Sliding board '[x]'$  Unable to Ambulate (see explanation below)  Cardio Status:  '[x]'$ Intact  '[]'$  Impaired   '[]'$  NA     ?????  Respiratory Status:  '[x]'$ Intact   '[]'$ Impaired   '[]'$ NA     ?????  Orthotics/Prosthetics: FES leg bike; has resting hand splints but does not use; uses quad pads for pressure relief for LE in the bed  Comments (Address manual vs power w/c vs scooter): Patient has mobility limitation that significantly impairs safe, timely participation in one or more mobility related ADL's that cannot be compensated for with the use of a cane or walker.  Patient experienced a complete C6 spinal cord injury  resulting in tetraplegia and is unable to stand or ambulate and is dependent upon a wheelchair for independent mobility.  Patient is not able to efficiently propel a manual wheelchair due to absent triceps for shoulder and elbow extension and absent finger flexion needed for manual propulsion.  He also presents with increased neuropathic pain in right wrist that may worsen with repetitive wrist movements used during propulsion.  Patient would not be safe to utilize a power scooter (POV).  Patient requires total assistance and use of hoyer lift or slideboard for transfers and would not be able to safely transfer onto or off of scooter.  Also due to tetraplegia patient lacks the trunk control and sitting balance required to sit unsupported on a scooter.  He also lacks sufficient UE strength to use tiller style controls.                Patient requires additional features/benefits provided by a power wheelchair for MRADL's in the home including power seating functions for participation in ADL, standing/weight bearing, rest periods, and effective pressure relief.  Patient is requesting to continue to utilize Buddy Switches (mini cup) for better and quicker access to power buttons and to change between seat functions.         Anterior / Posterior Obliquity Rotation-Pelvis ?????  PELVIS    '[]'$  '[x]'$  '[]'$   Neutral Posterior Anterior  '[x]'$  '[]'$  '[]'$   WFL Rt elev Lt elev  '[]'$  '[]'$  '[x]'$   WFL Right Left                      Anterior    Anterior     '[]'$  Fixed '[]'$  Other '[x]'$  Partly Flexible '[]'$  Flexible   '[]'$  Fixed '[]'$  Other '[]'$  Partly Flexible  '[]'$  Flexible  '[]'$  Fixed '[]'$  Other '[x]'$  Partly Flexible  '[]'$  Flexible   TRUNK  '[]'$  '[x]'$  '[]'$   WFL ? Thoracic ? Lumbar  Kyphosis Lordosis  '[]'$  '[]'$  '[x]'$   WFL Convex Convex  Right Left '[x]'$ c-curve '[]'$ s-curve '[]'$ multiple  '[x]'$  Neutral '[]'$  Left-anterior '[]'$  Right-anterior     '[]'$  Fixed '[]'$  Flexible '[x]'$  Partly Flexible '[]'$  Other  '[]'$  Fixed '[]'$  Flexible '[x]'$  Partly Flexible '[]'$  Other  '[]'$  Fixed              '[]'$  Flexible '[]'$  Partly Flexible '[]'$  Other    Position Windswept  R hip Adducted, L hip Abducted  HIPS          '[]'$            '[x]'$               '[x]'$    Neutral       Abduct        ADduct         '[]'$           '[]'$            '[x]'$   Neutral Right           Left      '[]'$  Fixed '[]'$  Subluxed '[x]'$  Partly Flexible '[]'$  Dislocated '[]'$  Flexible  '[]'$   Fixed '[]'$  Other '[x]'$  Partly Flexible  '[]'$  Flexible                 Foot Positioning Knee Positioning  ?????    '[x]'$  WFL  '[x]'$ Lt '[x]'$ Rt '[x]'$  WFL  '[x]'$ Lt '[x]'$ Rt    KNEES ROM concerns: ROM concerns:    & Dorsi-Flexed '[]'$ Lt '[]'$ Rt ?????    FEET Plantar Flexed '[]'$ Lt '[]'$ Rt      Inversion                 '[]'$ Lt '[]'$ Rt      Eversion                 '[]'$ Lt '[]'$ Rt     HEAD '[x]'$  Functional '[x]'$  Good Head Control  ?????  & '[]'$  Flexed         '[]'$  Extended '[]'$  Adequate Head Control    NECK '[]'$  Rotated  Lt  '[]'$  Lat Flexed Lt '[]'$  Rotated  Rt '[]'$  Lat Flexed Rt '[]'$  Limited Head Control     '[]'$  Cervical Hyperextension '[]'$  Absent  Head Control     SHOULDERS ELBOWS WRIST& HAND Use of tenodesis to hold items in hands      Left     Right    Left     Right    Left     Right   U/E '[x]'$ Functional           '[x]'$ Functional WFL WFL '[]'$ Fisting             '[]'$ Fisting      '[x]'$ elev   '[]'$ dep      '[]'$ elev   '[]'$ dep       '[]'$ pro -'[]'$ retract     '[]'$ pro  '[]'$ retract '[]'$ subluxed             '[]'$ subluxed           Goals for Wheelchair Mobility  '[x]'$  Independence with mobility in the home with motor related ADLs (MRADLs)  '[x]'$  Independence with MRADLs in the community '[]'$  Provide dependent mobility  '[x]'$  Provide recline     '[x]'$ Provide tilt   Goals for Seating system '[x]'$  Optimize pressure distribution '[x]'$  Provide support needed to facilitate function or safety '[x]'$  Provide corrective forces to assist with maintaining or improving posture '[]'$  Accommodate client's posture:   current seated postures and positions are not flexible or will not tolerate corrective forces '[x]'$  Client to be independent with relieving pressure in the  wheelchair '[x]'$ Enhance physiological function such as breathing, swallowing, digestion  Simulation ideas/Equipment trials:????? State why other equipment was unsuccessful:?????   MOBILITY BASE RECOMMENDATIONS and JUSTIFICATION: MOBILITY COMPONENT JUSTIFICATION  Manufacturer: Permobil Model: F3   Size: Width 17Seat Depth 21 '[x]'$ provide transport from point A to B      '[x]'$ promote Indep mobility  '[x]'$ is not a safe, functional ambulator '[x]'$ walker or cane inadequate '[]'$ non-standard width/depth necessary to accommodate anatomical measurement '[]'$  ?????  '[]'$ Manual Mobility Base '[]'$ non-functional ambulator    '[]'$ Scooter/POV  '[]'$ can safely operate  '[]'$ can safely transfer   '[]'$ has adequate trunk stability  '[]'$ cannot functionally propel manual w/c  '[x]'$ Power Mobility Base  '[x]'$ non-ambulatory  '[x]'$ cannot functionally propel manual wheelchair  '[x]'$  cannot functionally and safely operate scooter/POV '[x]'$ can safely operate and willing to  '[]'$ Stroller Base '[]'$ infant/child  '[]'$ unable to propel manual wheelchair '[]'$ allows for growth '[]'$ non-functional ambulator '[]'$ non-functional UE '[]'$ Indep mobility is not a goal at this time  '[x]'$ Tilt  '[x]'$ Anterior  '[x]'$ Backward '[x]'$ Powered tilt  '[]'$ Manual tilt  '[x]'$ change position against gravitational force on head and shoulders  '[x]'$ change position for  pressure relief/cannot weight shift '[x]'$ transfers  '[]'$ management of tone '[x]'$ rest periods '[]'$ control edema '[x]'$ facilitate postural control  '[x]'$  anterior tilt for standing, weight bearing, digestion  '[x]'$ Recline  '[x]'$ Power recline on power base '[]'$ Manual recline on manual base  '[]'$ accommodate femur to back angle  '[x]'$ bring to full recline for ADL care  '[x]'$ change position for pressure relief/cannot weight shift '[x]'$ rest periods '[x]'$ repositioning for transfers or clothing/diaper /catheter changes '[]'$ head positioning  '[]'$ Lighter weight required '[]'$ self- propulsion  '[]'$ lifting '[]'$    '[]'$ Heavy Duty required '[]'$ user weight greater than 250# '[]'$ extreme tone/ over  active movement '[]'$ broken frame on previous chair '[]'$  ?????  '[x]'$  Back  '[]'$  Angle Adjustable '[]'$  Custom molded Stretch air, contoured 3G back '[x]'$ postural control '[]'$ control of tone/spasticity '[]'$ accommodation of range of motion '[x]'$ UE functional control '[]'$ accommodation for seating system '[x]'$  Breathable and reduce perspiration to prevent skin breakdown '[x]'$ provide lateral trunk support '[]'$ accommodate deformity '[x]'$ provide posterior trunk support '[x]'$ provide lumbar/sacral support '[x]'$ support trunk in midline '[x]'$ Pressure relief over spinal processes  '[x]'$  Seat Cushion Jay 2 deep contour '[x]'$ impaired sensation  '[]'$ decubitus ulcers present '[]'$ history of pressure ulceration '[x]'$ prevent pelvic extension '[x]'$ low maintenance  '[x]'$ stabilize pelvis  '[]'$ accommodate obliquity '[]'$ accommodate multiple deformity '[x]'$ neutralize lower extremity position '[x]'$ increase pressure distribution '[]'$  ?????  '[x]'$  Pelvic/thigh support  '[x]'$  Lateral thigh guide '[]'$  Distal medial pad  '[x]'$  Distal lateral pad '[x]'$  pelvis in neutral '[]'$ accommodate pelvis '[x]'$  position upper legs '[x]'$  alignment '[]'$  accommodate ROM '[]'$  decr adduction '[]'$ accommodate tone '[x]'$ removable for transfers '[x]'$ decr abduction  '[]'$  Lateral trunk Supports '[]'$  Lt     '[]'$  Rt '[]'$ decrease lateral trunk leaning '[]'$ control tone '[]'$ contour for increased contact '[]'$ safety  '[]'$ accommodate asymmetry '[]'$  ?????  '[x]'$  Mounting hardware  '[]'$ lateral trunk supports  '[]'$ back   '[]'$ seat '[x]'$ headrest      '[x]'$  thigh support '[]'$ fixed   '[]'$ swing away '[]'$ attach seat platform/cushion to w/c frame '[]'$ attach back cushion to w/c frame '[x]'$ mount thigh supports '[x]'$ mount headrest  '[]'$ swing medial thigh support away '[]'$ swing lateral supports away for transfers  '[x]'$  remove for transfers    Armrests  '[]'$ fixed '[x]'$ adjustable height '[]'$ removable   '[]'$ swing away  '[x]'$ flip back   '[]'$ reclining '[x]'$ full length pads '[]'$ desk    '[]'$ pads tubular  '[x]'$ provide support with elbow at 90   '[x]'$ provide support for w/c tray '[x]'$ change of  height/angles for variable activities '[x]'$ remove for transfers '[]'$ allow to come closer to table top '[x]'$ remove for access to tables '[x]'$  Gel pads due to elbow breakdown  Hangers/ Leg rests  '[]'$ 60 '[]'$ 70 '[]'$ 90 '[x]'$ elevating '[]'$ heavy duty  '[x]'$ articulating '[]'$ fixed '[]'$ lift off '[]'$ swing away     '[x]'$ power '[x]'$ provide LE support  '[]'$ accommodate to hamstring tightness '[x]'$ elevate legs during recline   '[x]'$ provide change in position for Legs '[x]'$ Maintain placement of feet on footplate '[]'$ durability '[]'$ enable transfers '[]'$ decrease edema '[]'$ Accommodate lower leg length '[]'$  ?????  Foot support Footplate    '[]'$ Lt  '[]'$  Rt  '[x]'$  Center mount '[x]'$ flip up     '[x]'$ depth/angle adjustable '[]'$ Amputee adapter    '[]'$  Lt     '[]'$  Rt '[x]'$ provide foot support '[x]'$ accommodate to ankle ROM '[x]'$ transfers '[]'$ Provide support for residual extremity '[]'$  allow foot to go under wheelchair base '[]'$  decrease tone  '[]'$  ?????  '[]'$  Ankle strap/heel loops '[]'$ support foot on foot support '[]'$ decrease extraneous movement '[]'$ provide input to heel  '[]'$ protect foot  Tires: '[x]'$ pneumatic  '[x]'$ flat free inserts  '[]'$ solid  '[x]'$ decrease maintenance  '[x]'$ prevent frequent flats '[]'$ increase shock absorbency '[]'$ decrease pain from road shock '[]'$ decrease spasms from road shock '[]'$  ?????  '[x]'$  Headrest  '[x]'$ provide posterior head support '[]'$ provide posterior neck support '[]'$ provide lateral head  support '[]'$ provide anterior head support '[x]'$ support during tilt and recline '[]'$ improve feeding   '[]'$ improve respiration '[]'$ placement of switches '[]'$ safety  '[]'$ accommodate ROM  '[]'$ accommodate tone '[]'$ improve visual orientation  '[x]'$  Anterior chest strap '[]'$  Vest '[]'$  Shoulder retractors  '[x]'$ decrease forward movement of shoulder '[]'$ accommodation of TLSO '[x]'$ decrease forward movement of trunk '[]'$ decrease shoulder elevation '[]'$ added abdominal support '[]'$ alignment '[]'$ assistance with shoulder control  '[x]'$  support during standing  Pelvic Positioner '[x]'$ Belt '[]'$ SubASIS bar '[]'$ Dual Pull '[]'$ stabilize  tone '[x]'$ decrease falling out of chair/ **will not Decr potential for sliding due to pelvic tilting '[]'$ prevent excessive rotation '[]'$ pad for protection over boney prominence '[]'$ prominence comfort '[]'$ special pull angle to control rotation '[]'$  ?????  Upper Extremity Support '[]'$ L   '[]'$  R '[]'$ Arm trough    '[]'$ hand support '[]'$  tray       '[x]'$ full tray '[]'$ swivel mount '[]'$ decrease edema      '[]'$ decrease subluxation   '[]'$ control tone   '[x]'$ placement for AAC/Computer/EADL '[]'$ decrease gravitational pull on shoulders '[x]'$ provide midline positioning '[x]'$ provide support to increase UE function '[]'$ provide hand support in natural position '[x]'$ provide work surface   POWER WHEELCHAIR CONTROLS  '[x]'$ Proportional  '[]'$ Non-Proportional Type Goal post joystick '[]'$ Left  '[x]'$ Right '[x]'$ provides access for controlling wheelchair   '[]'$ lacks motor control to operate proportional drive control '[]'$ unable to understand proportional controls  Actuator Control Module  '[]'$ Single  '[x]'$ Multiple   '[x]'$ Allow the client to operate the power seat function(s) through the joystick control   '[]'$ Safety Reset Switches '[]'$ Used to change modes and stop the wheelchair when driving in latch mode    '[x]'$ Upgraded Electronics   '[x]'$ programming for accurate control '[]'$ progressive Disease/changing condition '[]'$ non-proportional drive control needed '[x]'$ Needed in order to operate power seat functions through joystick control   '[]'$ Display box '[]'$ Allows user to see in which mode and drive the wheelchair is set  '[]'$ necessary for alternate controls    '[]'$ Digital interface electronics '[]'$ Allows w/c to operate when using alternative drive controls  '[]'$ ASL Head Array '[]'$ Allows client to operate wheelchair  through switches placed in tri-panel headrest  '[]'$ Sip and puff with tubing kit '[]'$ needed to operate sip and puff drive controls  '[]'$ Upgraded tracking electronics '[]'$ increase safety when driving '[]'$ correct tracking when on uneven surfaces  '[x]'$ Mount for switches or joystick '[x]'$ Attaches  switches to w/c  '[x]'$ Swing away for access or transfers '[]'$ midline for optimal placement '[x]'$ provides for consistent access  '[]'$ Attendant controlled joystick plus mount '[]'$ safety '[]'$ long distance driving '[]'$ operation of seat functions '[]'$ compliance with transportation regulations '[]'$  ?????    Rear wheel placement/Axle adjustability '[]'$ None '[]'$ semi adjustable '[]'$ fully adjustable  '[]'$ improved UE access to wheels '[]'$ improved stability '[]'$ changing angle in space for improvement of postural stability '[]'$ 1-arm drive access '[]'$ amputee pad placement '[]'$  ?????  Wheel rims/ hand rims  '[]'$ metal  '[]'$ plastic coated '[]'$ oblique projections '[]'$ vertical projections '[]'$ Provide ability to propel manual wheelchair  '[]'$  Increase self-propulsion with hand weakness/decreased grasp  Push handles '[]'$ extended  '[]'$ angle adjustable  '[]'$ standard '[]'$ caregiver access '[]'$ caregiver assist '[]'$ allows "hooking" to enable increased ability to perform ADLs or maintain balance  One armed device  '[]'$ Lt   '[]'$ Rt '[]'$ enable propulsion of manual wheelchair with one arm   '[]'$  ?????   Brake/wheel lock extension '[]'$  Lt   '[]'$  Rt '[]'$ increase indep in applying wheel locks   '[]'$ Side guards '[]'$ prevent clothing getting caught in wheel or becoming soiled '[]'$  prevent skin tears/abrasions  Battery: Group 24 x 2 '[x]'$ to power wheelchair ?????  Other: Power Adjustable Seat Height To use with anterior tilt for standing and weight beraing To improve ability to participate in ADL  The above equipment has  a life- long use expectancy. Growth and changes in medical and/or functional conditions would be the exceptions. This is to certify that the therapist has no financial relationship with durable medical provider or manufacturer. The therapist will not receive remuneration of any kind for the equipment recommended in this evaluation.   Patient has mobility limitation that significantly impairs safe, timely participation in one or more mobility related ADL's.  (bathing, toileting, feeding,  dressing, grooming, moving from room to room)                                                             '[x]'$  Yes '[]'$  No Will mobility device sufficiently improve ability to participate and/or be aided in participation of MRADL's?         '[x]'$  Yes '[]'$  No Can limitation be compensated for with use of a cane or walker?                                                                                '[]'$  Yes '[x]'$  No Does patient or caregiver demonstrate ability/potential ability & willingness to safely use the mobility device?   '[x]'$  Yes '[]'$  No Does patient's home environment support use of recommended mobility device?                                                    '[x]'$  Yes '[]'$  No Does patient have sufficient upper extremity function necessary to functionally propel a manual wheelchair?    '[]'$  Yes '[x]'$  No Does patient have sufficient strength and trunk stability to safely operate a POV (scooter)?                                  '[]'$  Yes '[x]'$  No Does patient need additional features/benefits provided by a power wheelchair for MRADL's in the home?       '[x]'$  Yes '[]'$  No Does the patient demonstrate the ability to safely use a power wheelchair?                                                              '[x]'$  Yes '[]'$  No  Therapist Name Printed: Tilda Burrow. Melrose Nakayama, PT, DPT Date: 05/28/20  Therapist's Signature:   Date:   Supplier's Name Printed: Deberah Pelton, [employer] Date: 05/28/20  Supplier's Signature:   Date:  Patient/Caregiver Signature:   Date:     This is to certify that I have read this evaluation and do agree with the content within:      Physician's Name Printed: Lorelee Market, MD  Physician's  Signature:  Date:     This is to certify that I, the above signed therapist have the following affiliations: '[]'$  This DME provider '[]'$  Manufacturer of recommended equipment '[]'$  Patient's long term care facility '[x]'$  None of the above        Objective measurements completed on examination: See above findings.       LMN for roll in shower chair:  Dasan Hardman was evaluated for a new roll in shower chair to utilize at home in his accessible roll in shower.  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  The Nuprodx Roll in [employer] with Tilt in [place] is being recommended to enhance the opportunity for [place] to safely participate in Motor Related Activities of Daily Living, MRADL, which include: catheter management, perianal hygiene, and showering.  The shower/commode chair provides mobility between the bedroom and bathroom (toilet and shower) which decreases the number of transfers required for catheter management and showering, thus decreasing fall / injury risk, stress and strain on joints during transfers, and improved efficiency for hygiene management needs.   The tilt in space is being recommended to allow posterior tilt to change the gravitational pull on the head and upper spine, thereby increasing trunk stability and sitting tolerance while reducing stress on the lower back.  The 17 x 20 deep frame and cushion upgrade are recommended to accommodate patient's height and leg length and due to his posterior pelvic tilt, the client requires a longer seat to support the full length of their upper leg for stability and pressure distribution.  Cushion upgrade is recommended to improve pressure distribution.  The [place] is recommended to provide trunk stability, prevent falling forwards and to maintain position of trunk and pelvis during mobility and bathing.  The Padded Calf Panel is required to prevent the client's feet from falling rearward and off the footplates which can cause injury to the lower extremities.    Thank you and please do not hesitate to contact if further information is required.   Tilda Burrow Melrose Nakayama, PT, DPT         PT Education - 05/28/20 1253     Education Details educated pt and mother about process for obtaining a replacement air mattress; requires physician prescription and chart note with justification; gave pt and mother contact information for [facility] who can provide air mattress through insurance    Person(s) Educated Patient;Parent(s)    Methods Explanation;Handout    Comprehension Verbalized understanding                       Plan - 05/28/20 1256    Clinical Impression Statement Pt is a 39 year old male referred to outpatient neurorehabilitation for assessment of new power wheelchair and roll in shower chair.  Pt's PMH is significant for: tetraplegia and cervical fusion.  PT evaluation demonstrated the following impairments: impaired and absent sensation at the C4 level, impaired UE strength with absent tricep and wrist flexor strength, absent strength in bilat LE, impaired posture and postural control, mild hypertonicity, impaired sitting balance and pain in right wrist.  Pt would benefit from a new power wheelchair with power seating functions and anterior tilt for standing to continue to provide pt with independent mobility and participation in MRADL and for effective positioning and pressure relief.  Pt would benefit from a new roll in shower chair with recline  to continue to allow dependent transfers into and out of roll in shower and to provide sufficient support for safe bathing and hygiene.    Personal Factors and Comorbidities Comorbidity 1    Comorbidities tetraplegia    Examination-Activity Limitations Bathing;Dressing;Hygiene/Grooming;Locomotion Level;Toileting;Transfers    Examination-Participation Restrictions Community Activity;Shop    Stability/Clinical Decision Making Evolving/Moderate complexity    Clinical Decision Making Moderate    Rehab Potential Good    PT Frequency One time visit    PT Duration Other (comment)   one visit for w/c eval only   Consulted and Agree with Plan of Care  Patient;Family member/caregiver           Patient will benefit from skilled therapeutic intervention in order to improve the following deficits and impairments:  Decreased balance, Decreased strength, Increased muscle spasms, Impaired sensation, Impaired tone, Impaired UE functional use, Postural dysfunction, Pain  Visit Diagnosis: Quadriplegia, C5-C7 complete (HCC)  Abnormal posture  Other disturbances of skin sensation     Problem List Patient Active Problem List   Diagnosis Date Noted  . Sepsis ([place]) 11/22/2017    Rico Junker, PT, DPT 05/28/20    1:14 PM    [facility] [address] [place], [place], [address] Phone: [phone]   Fax:  [phone]  Name: ALIJA RIANO MRN: [id number] Date of Birth: 01/26/1981

## 2021-02-01 ENCOUNTER — Ambulatory Visit: Payer: BC Managed Care – PPO | Admitting: Dermatology

## 2021-03-11 ENCOUNTER — Other Ambulatory Visit: Payer: Self-pay

## 2021-03-11 ENCOUNTER — Ambulatory Visit (INDEPENDENT_AMBULATORY_CARE_PROVIDER_SITE_OTHER): Payer: BC Managed Care – PPO | Admitting: Dermatology

## 2021-03-11 ENCOUNTER — Encounter: Payer: Self-pay | Admitting: Dermatology

## 2021-03-11 DIAGNOSIS — D485 Neoplasm of uncertain behavior of skin: Secondary | ICD-10-CM

## 2021-03-11 DIAGNOSIS — L219 Seborrheic dermatitis, unspecified: Secondary | ICD-10-CM | POA: Diagnosis not present

## 2021-03-11 DIAGNOSIS — D224 Melanocytic nevi of scalp and neck: Secondary | ICD-10-CM

## 2021-03-11 MED ORDER — HYDROCORTISONE 2.5 % EX LOTN: TOPICAL_LOTION | CUTANEOUS | 6 refills | Status: DC

## 2021-03-11 MED ORDER — KETOCONAZOLE 2 % EX SHAM: MEDICATED_SHAMPOO | CUTANEOUS | 6 refills | Status: AC

## 2021-03-11 NOTE — Progress Notes (Signed)
   Follow-Up Visit   Subjective  Matthew Davenport is a 40 y.o. male who presents for the following: irritated mole (L scalp - itchy, irritated, patient would like lesion removed). Patient would like refills of HC 2.5% for flaking and scale of the beard area.  The following portions of the chart were reviewed this encounter and updated as appropriate:   Tobacco  Allergies  Meds  Problems  Med Hx  Surg Hx  Fam Hx     Review of Systems:  No other skin or systemic complaints except as noted in HPI or Assessment and Plan.  Objective  Well appearing patient in no apparent distress; mood and affect are within normal limits.  A focused examination was performed including the scalp . Relevant physical exam findings are noted in the Assessment and Plan.  Objective  L scalp: 1.1 cm flesh colored papule   Objective  Face: Pink patches with greasy scale.     Assessment & Plan  Neoplasm of uncertain behavior of skin L scalp  Epidermal / dermal shaving  Lesion diameter (cm):  1.1 Informed consent: discussed and consent obtained   Timeout: patient name, date of birth, surgical site, and procedure verified   Procedure prep:  Patient was prepped and draped in usual sterile fashion Prep type:  Isopropyl alcohol Anesthesia: the lesion was anesthetized in a standard fashion   Anesthetic:  1% lidocaine w/ epinephrine 1-100,000 buffered w/ 8.4% NaHCO3 Instrument used: flexible razor blade   Hemostasis achieved with: pressure, aluminum chloride and electrodesiccation   Outcome: patient tolerated procedure well   Post-procedure details: sterile dressing applied and wound care instructions given   Dressing type: bandage and petrolatum    Specimen 1 - Surgical pathology Differential Diagnosis: D48.5 irritated nevus r/o dysplasia Check Margins: No 1.1 cm flesh colored papule  Seborrheic dermatitis Face  Seborrheic Dermatitis  -  is a chronic persistent rash characterized by pinkness  and scaling most commonly of the mid face but also can occur on the scalp (dandruff), ears; mid chest and mid back. It tends to be exacerbated by stress and cooler weather.  People who have neurologic disease may experience new onset or exacerbation of existing seborrheic dermatitis.  The condition is not curable but treatable and can be controlled.  Continue HC 2.5% lotion QOD. Start Ketoconazole 2% shampoo 3d/wk to beard area.  ketoconazole (NIZORAL) 2 % shampoo - Face  hydrocortisone 2.5 % lotion - Face  Return in about 1 year (around 03/11/2022) for TBSE - hx dysplastic nevi.  Luther Redo, CMA, am acting as scribe for Sarina Ser, MD .  Documentation: I have reviewed the above documentation for accuracy and completeness, and I agree with the above.  Sarina Ser, MD

## 2021-03-11 NOTE — Patient Instructions (Signed)
If you have any questions or concerns for your doctor, please call our main line at [phone] and press option 4 to reach your doctor's medical assistant. If no one answers, please leave a voicemail as directed and we will return your call as soon as possible. Messages left after 4 pm will be answered the following business day.   You may also send us a message via MyChart. We typically respond to MyChart messages within 1-2 business days.  For prescription refills, please ask your pharmacy to contact our office. Our fax number is [phone].  If you have an urgent issue when the clinic is closed that cannot wait until the next business day, you can page your doctor at the number below.    Please note that while we do our best to be available for urgent issues outside of office hours, we are not available 24/7.   If you have an urgent issue and are unable to reach us, you may choose to seek medical care at your doctor's office, retail clinic, urgent care center, or emergency room.  If you have a medical emergency, please immediately call 911 or go to the emergency department.  Pager Numbers  - Dr. Kowalski: [phone]  - Dr. Moye: [phone]  - Dr. Stewart: [phone]  In the event of inclement weather, please call our main line at [phone] for an update on the status of any delays or closures.  Dermatology Medication Tips: Please keep the boxes that topical medications come in in order to help keep track of the instructions about where and how to use these. Pharmacies typically print the medication instructions only on the boxes and not directly on the medication tubes.   If your medication is too expensive, please contact our office at [phone] option 4 or send us a message through MyChart.   We are unable to tell what your co-pay for medications will be in advance as this is different depending on your insurance coverage. However, we may be able to find a  substitute medication at lower cost or fill out paperwork to get insurance to cover a needed medication.   If a prior authorization is required to get your medication covered by your insurance company, please allow us 1-2 business days to complete this process.  Drug prices often vary depending on where the prescription is filled and some pharmacies may offer cheaper prices.  The website www.goodrx.com contains coupons for medications through different pharmacies. The prices here do not account for what the cost may be with help from insurance (it may be cheaper with your insurance), but the website can give you the price if you did not use any insurance.  - You can print the associated coupon and take it with your prescription to the pharmacy.  - You may also stop by our office during regular business hours and pick up a GoodRx coupon card.  - If you need your prescription sent electronically to a different pharmacy, notify our office through [facility] MyChart or by phone at [phone] option 4.     Wound Care Instructions  1. Cleanse wound gently with soap and water once a day then pat dry with clean gauze. Apply a thing coat of Petrolatum (petroleum jelly, "Vaseline") over the wound (unless you have an allergy to this). We recommend that you use a new, sterile tube of Vaseline. Do not pick or remove scabs. Do not remove the yellow or white "healing tissue" from the base of the wound.    2. Cover the wound with fresh, clean, nonstick gauze and secure with paper tape. You may use Band-Aids in place of gauze and tape if the would is small enough, but would recommend trimming much of the tape off as there is often too much. Sometimes Band-Aids can irritate the skin.  3. You should call the office for your biopsy report after 1 week if you have not already been contacted.  4. If you experience any problems, such as abnormal amounts of bleeding, swelling, significant bruising, significant pain,  or evidence of infection, please call the office immediately.  5. FOR ADULT SURGERY PATIENTS: If you need something for pain relief you may take 1 extra strength Tylenol (acetaminophen) AND 2 Ibuprofen (200mg each) together every 4 hours as needed for pain. (do not take these if you are allergic to them or if you have a reason you should not take them.) Typically, you may only need pain medication for 1 to 3 days.     

## 2021-03-14 ENCOUNTER — Encounter: Payer: Self-pay | Admitting: Dermatology

## 2021-03-16 ENCOUNTER — Telehealth: Payer: Self-pay

## 2021-03-16 NOTE — Telephone Encounter (Signed)
Left message on voicemail to return my call.  

## 2021-03-16 NOTE — Telephone Encounter (Signed)
-----   Message from Ralene Bathe, MD sent at 03/15/2021  5:05 PM EDT ----- Diagnosis Skin , left scalp MELANOCYTIC NEVUS, INTRADERMAL TYPE, BASE INVOLVED  Benign mole No further treatment needed

## 2021-04-13 ENCOUNTER — Telehealth: Payer: Self-pay

## 2021-04-13 NOTE — Telephone Encounter (Signed)
-----   Message from Ralene Bathe, MD sent at 03/15/2021  5:05 PM EDT ----- Diagnosis Skin , left scalp MELANOCYTIC NEVUS, INTRADERMAL TYPE, BASE INVOLVED  Benign mole No further treatment needed

## 2021-04-13 NOTE — Telephone Encounter (Signed)
Unable to leave a message. Someone answered the phone then hung up.

## 2021-04-27 ENCOUNTER — Other Ambulatory Visit: Payer: Self-pay | Admitting: Orthopedic Surgery

## 2021-04-27 DIAGNOSIS — G8220 Paraplegia, unspecified: Secondary | ICD-10-CM

## 2021-05-11 ENCOUNTER — Ambulatory Visit
Admission: RE | Admit: 2021-05-11 | Discharge: 2021-05-11 | Disposition: A | Discharge status: Home / Self Care | Payer: BC Managed Care – PPO | Source: Ambulatory Visit | Attending: Orthopedic Surgery | Admitting: Orthopedic Surgery

## 2021-05-11 ENCOUNTER — Other Ambulatory Visit: Payer: Self-pay

## 2021-05-11 DIAGNOSIS — G8220 Paraplegia, unspecified: Secondary | ICD-10-CM | POA: Insufficient documentation

## 2021-07-01 DIAGNOSIS — Z95828 Presence of other vascular implants and grafts: Secondary | ICD-10-CM | POA: Insufficient documentation

## 2021-07-01 DIAGNOSIS — I82409 Acute embolism and thrombosis of unspecified deep veins of unspecified lower extremity: Secondary | ICD-10-CM | POA: Insufficient documentation

## 2021-07-01 DIAGNOSIS — N390 Urinary tract infection, site not specified: Secondary | ICD-10-CM | POA: Insufficient documentation

## 2021-07-01 DIAGNOSIS — G8220 Paraplegia, unspecified: Secondary | ICD-10-CM | POA: Insufficient documentation

## 2021-07-23 DIAGNOSIS — G8929 Other chronic pain: Secondary | ICD-10-CM | POA: Insufficient documentation

## 2022-05-11 ENCOUNTER — Ambulatory Visit (INDEPENDENT_AMBULATORY_CARE_PROVIDER_SITE_OTHER): Payer: Medicaid Other | Admitting: Dermatology

## 2022-05-11 DIAGNOSIS — L720 Epidermal cyst: Secondary | ICD-10-CM | POA: Diagnosis not present

## 2022-05-11 DIAGNOSIS — D229 Melanocytic nevi, unspecified: Secondary | ICD-10-CM

## 2022-05-11 DIAGNOSIS — L219 Seborrheic dermatitis, unspecified: Secondary | ICD-10-CM | POA: Diagnosis not present

## 2022-05-11 DIAGNOSIS — D1800 Hemangioma unspecified site: Secondary | ICD-10-CM

## 2022-05-11 DIAGNOSIS — L700 Acne vulgaris: Secondary | ICD-10-CM | POA: Diagnosis not present

## 2022-05-11 DIAGNOSIS — L821 Other seborrheic keratosis: Secondary | ICD-10-CM

## 2022-05-11 DIAGNOSIS — Z1283 Encounter for screening for malignant neoplasm of skin: Secondary | ICD-10-CM

## 2022-05-11 DIAGNOSIS — Z86018 Personal history of other benign neoplasm: Secondary | ICD-10-CM

## 2022-05-11 DIAGNOSIS — L814 Other melanin hyperpigmentation: Secondary | ICD-10-CM

## 2022-05-11 DIAGNOSIS — L578 Other skin changes due to chronic exposure to nonionizing radiation: Secondary | ICD-10-CM

## 2022-05-11 MED ORDER — ADAPALENE 0.3 % EX GEL: CUTANEOUS | 6 refills | Status: DC

## 2022-05-11 MED ORDER — HYDROCORTISONE 2.5 % EX LOTN: TOPICAL_LOTION | CUTANEOUS | 6 refills | Status: DC

## 2022-05-11 NOTE — Patient Instructions (Signed)
Due to recent changes in healthcare laws, you may see results of your pathology and/or laboratory studies on MyChart before the doctors have had a chance to review them. We understand that in some cases there may be results that are confusing or concerning to you. Please understand that not all results are received at the same time and often the doctors may need to interpret multiple results in order to provide you with the best plan of care or course of treatment. Therefore, we ask that you please give us 2 business days to thoroughly review all your results before contacting the office for clarification. Should we see a critical lab result, you will be contacted sooner.   If You Need Anything After Your Visit  If you have any questions or concerns for your doctor, please call our main line at [phone] and press option 4 to reach your doctor's medical assistant. If no one answers, please leave a voicemail as directed and we will return your call as soon as possible. Messages left after 4 pm will be answered the following business day.   You may also send us a message via MyChart. We typically respond to MyChart messages within 1-2 business days.  For prescription refills, please ask your pharmacy to contact our office. Our fax number is [phone].  If you have an urgent issue when the clinic is closed that cannot wait until the next business day, you can page your doctor at the number below.    Please note that while we do our best to be available for urgent issues outside of office hours, we are not available 24/7.   If you have an urgent issue and are unable to reach us, you may choose to seek medical care at your doctor's office, retail clinic, urgent care center, or emergency room.  If you have a medical emergency, please immediately call 911 or go to the emergency department.  Pager Numbers  - Dr. Kowalski: [phone]  - Dr. Moye: [phone]  - Dr. Stewart:  [phone]  In the event of inclement weather, please call our main line at [phone] for an update on the status of any delays or closures.  Dermatology Medication Tips: Please keep the boxes that topical medications come in in order to help keep track of the instructions about where and how to use these. Pharmacies typically print the medication instructions only on the boxes and not directly on the medication tubes.   If your medication is too expensive, please contact our office at [phone] option 4 or send us a message through MyChart.   We are unable to tell what your co-pay for medications will be in advance as this is different depending on your insurance coverage. However, we may be able to find a substitute medication at lower cost or fill out paperwork to get insurance to cover a needed medication.   If a prior authorization is required to get your medication covered by your insurance company, please allow us 1-2 business days to complete this process.  Drug prices often vary depending on where the prescription is filled and some pharmacies may offer cheaper prices.  The website www.goodrx.com contains coupons for medications through different pharmacies. The prices here do not account for what the cost may be with help from insurance (it may be cheaper with your insurance), but the website can give you the price if you did not use any insurance.  - You can print the associated coupon and take it with   your prescription to the pharmacy.  - You may also stop by our office during regular business hours and pick up a GoodRx coupon card.  - If you need your prescription sent electronically to a different pharmacy, notify our office through [facility] MyChart or by phone at [phone] option 4.     Si Usted Necesita Algo Despus de Su Visita  Tambin puede enviarnos un mensaje a travs de MyChart. Por lo general respondemos a los mensajes de MyChart en el transcurso de 1 a 2  das hbiles.  Para renovar recetas, por favor pida a su farmacia que se ponga en contacto con nuestra oficina. Nuestro nmero de fax es el [phone].  Si tiene un asunto urgente cuando la clnica est cerrada y que no puede esperar hasta el siguiente da hbil, puede llamar/localizar a su doctor(a) al nmero que aparece a continuacin.   Por favor, tenga en cuenta que aunque hacemos todo lo posible para estar disponibles para asuntos urgentes fuera del horario de oficina, no estamos disponibles las 24 horas del da, los 7 das de la semana.   Si tiene un problema urgente y no puede comunicarse con nosotros, puede optar por buscar atencin mdica  en el consultorio de su doctor(a), en una clnica privada, en un centro de atencin urgente o en una sala de emergencias.  Si tiene una emergencia mdica, por favor llame inmediatamente al 911 o vaya a la sala de emergencias.  Nmeros de bper  - Dr. Kowalski: [phone]  - Dra. Moye: [phone]  - Dra. Stewart: [phone]  En caso de inclemencias del tiempo, por favor llame a nuestra lnea principal al [phone] para una actualizacin sobre el estado de cualquier retraso o cierre.  Consejos para la medicacin en dermatologa: Por favor, guarde las cajas en las que vienen los medicamentos de uso tpico para ayudarle a seguir las instrucciones sobre dnde y cmo usarlos. Las farmacias generalmente imprimen las instrucciones del medicamento slo en las cajas y no directamente en los tubos del medicamento.   Si su medicamento es muy caro, por favor, pngase en contacto con nuestra oficina llamando al [phone] y presione la opcin 4 o envenos un mensaje a travs de MyChart.   No podemos decirle cul ser su copago por los medicamentos por adelantado ya que esto es diferente dependiendo de la cobertura de su seguro. Sin embargo, es posible que podamos encontrar un medicamento sustituto a menor costo o llenar un formulario para que el  seguro cubra el medicamento que se considera necesario.   Si se requiere una autorizacin previa para que su compaa de seguros cubra su medicamento, por favor permtanos de 1 a 2 das hbiles para completar este proceso.  Los precios de los medicamentos varan con frecuencia dependiendo del lugar de dnde se surte la receta y alguna farmacias pueden ofrecer precios ms baratos.  El sitio web www.goodrx.com tiene cupones para medicamentos de diferentes farmacias. Los precios aqu no tienen en cuenta lo que podra costar con la ayuda del seguro (puede ser ms barato con su seguro), pero el sitio web puede darle el precio si no utiliz ningn seguro.  - Puede imprimir el cupn correspondiente y llevarlo con su receta a la farmacia.  - Tambin puede pasar por nuestra oficina durante el horario de atencin regular y recoger una tarjeta de cupones de GoodRx.  - Si necesita que su receta se enve electrnicamente a una farmacia diferente, informe a nuestra oficina a travs de MyChart de [facility]   o por telfono llamando al [phone] y presione la opcin 4.  

## 2022-05-11 NOTE — Progress Notes (Signed)
Follow-Up Visit   Subjective  Matthew Davenport is a 41 y.o. male who presents for the following: Seborrheic dermatitis (Face/beard area - currently using HC 2.5% lotion to aa's QD PRN he is not using Ketoconazole 2% shampoo). The patient presents for Upper Body Skin Exam (UBSE) for skin cancer screening and mole check.  The patient has spots, moles and lesions to be evaluated, some may be new or changing and the patient has concerns that these could be cancer.  The following portions of the chart were reviewed this encounter and updated as appropriate:   Tobacco  Allergies  Meds  Problems  Med Hx  Surg Hx  Fam Hx     Review of Systems:  No other skin or systemic complaints except as noted in HPI or Assessment and Plan.  Objective  Well appearing patient in no apparent distress; mood and affect are within normal limits.  All skin waist up examined.  Face Pink patches with greasy scale.   Face, chest, back Open and closed comedones on the back. Some scarring on the shoulders.  L cheek Firm SQ nodule 0.8 x 0.5 cm       Assessment & Plan  Seborrheic dermatitis Face Chronic and persistent condition with duration or expected duration over one year. Condition is symptomatic / bothersome to patient. Not to goal. Seborrheic Dermatitis  -  is a chronic persistent rash characterized by pinkness and scaling most commonly of the mid face but also can occur on the scalp (dandruff), ears; mid chest, mid back and groin.  It tends to be exacerbated by stress and cooler weather.  People who have neurologic disease may experience new onset or exacerbation of existing seborrheic dermatitis.  The condition is not curable but treatable and can be controlled.  Continue HC 2.5% lotion to aa's QD PRN up to 4 days per week.  Pt declines Ketoconazole cr or shampoo today.  Acne vulgaris Face, chest, back Recommend Cln acne wash and Differin 0.3% gel QHS.  Chronic and persistent condition with  duration or expected duration over one year. Condition is symptomatic / bothersome to patient. Not to goal.   Topical retinoid medications like tretinoin/Retin-A, adapalene/Differin, tazarotene/Fabior, and Epiduo/Epiduo Forte can cause dryness and irritation when first started. Only apply a pea-sized amount to the entire affected area. Avoid applying it around the eyes, edges of mouth and creases at the nose. If you experience irritation, use a good moisturizer first and/or apply the medicine less often. If you are doing well with the medicine, you can increase how often you use it until you are applying every night. Be careful with sun protection while using this medication as it can make you sensitive to the sun. This medicine should not be used by pregnant women.   Adapalene (DIFFERIN) 0.3 % gel - Face, chest, back Apply to the back QHS.  Epidermal inclusion cyst L cheek Benign-appearing. Exam most consistent with an epidermal inclusion cyst. Discussed that a cyst is a benign growth that can grow over time and sometimes get irritated or inflamed. Recommend observation if it is not bothersome. Discussed option of surgical excision to remove it if it is growing, symptomatic, or other changes noted. Please call for new or changing lesions so they can be evaluated.  Spot treat with Differin 0.3% gel QHS.  Discussed IL steroid injection; I&D and excision options.  Skin cancer screening  Lentigines - Scattered tan macules - Due to sun exposure - Benign-appearing, observe - Recommend  daily broad spectrum sunscreen SPF 30+ to sun-exposed areas, reapply every 2 hours as needed. - Call for any changes  Seborrheic Keratoses - Stuck-on, waxy, tan-brown papules and/or plaques  - Benign-appearing - Discussed benign etiology and prognosis. - Observe - Call for any changes  Melanocytic Nevi - Tan-brown and/or pink-flesh-colored symmetric macules and papules - Benign appearing on exam today -  Observation - Call clinic for new or changing moles - Recommend daily use of broad spectrum spf 30+ sunscreen to sun-exposed areas.   Hemangiomas - Red papules - Discussed benign nature - Observe - Call for any changes  Actinic Damage - Chronic condition, secondary to cumulative UV/sun exposure - diffuse scaly erythematous macules with underlying dyspigmentation - Recommend daily broad spectrum sunscreen SPF 30+ to sun-exposed areas, reapply every 2 hours as needed.  - Staying in the shade or wearing long sleeves, sun glasses (UVA+UVB protection) and wide brim hats (4-inch brim around the entire circumference of the hat) are also recommended for sun protection.  - Call for new or changing lesions.  History of Dysplastic Nevi - No evidence of recurrence today - Recommend regular full body skin exams - Recommend daily broad spectrum sunscreen SPF 30+ to sun-exposed areas, reapply every 2 hours as needed.  - Call if any new or changing lesions are noted between office visits  Skin cancer screening performed today.  Return in about 1 year (around 05/12/2023) for UBSE.  Luther Redo, CMA, am acting as scribe for Sarina Ser, MD . Documentation: I have reviewed the above documentation for accuracy and completeness, and I agree with the above.  Sarina Ser, MD

## 2022-05-12 ENCOUNTER — Encounter: Payer: Self-pay | Admitting: Dermatology
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
## 2023-05-17 ENCOUNTER — Ambulatory Visit (INDEPENDENT_AMBULATORY_CARE_PROVIDER_SITE_OTHER): Payer: Medicaid Other | Admitting: Dermatology

## 2023-05-17 VITALS — BP 87/57 | HR 80

## 2023-05-17 DIAGNOSIS — L814 Other melanin hyperpigmentation: Secondary | ICD-10-CM | POA: Diagnosis not present

## 2023-05-17 DIAGNOSIS — D229 Melanocytic nevi, unspecified: Secondary | ICD-10-CM

## 2023-05-17 DIAGNOSIS — D225 Melanocytic nevi of trunk: Secondary | ICD-10-CM

## 2023-05-17 DIAGNOSIS — L729 Follicular cyst of the skin and subcutaneous tissue, unspecified: Secondary | ICD-10-CM

## 2023-05-17 DIAGNOSIS — D489 Neoplasm of uncertain behavior, unspecified: Secondary | ICD-10-CM

## 2023-05-17 DIAGNOSIS — D239 Other benign neoplasm of skin, unspecified: Secondary | ICD-10-CM

## 2023-05-17 DIAGNOSIS — L578 Other skin changes due to chronic exposure to nonionizing radiation: Secondary | ICD-10-CM

## 2023-05-17 DIAGNOSIS — Z79899 Other long term (current) drug therapy: Secondary | ICD-10-CM

## 2023-05-17 DIAGNOSIS — L700 Acne vulgaris: Secondary | ICD-10-CM

## 2023-05-17 DIAGNOSIS — L219 Seborrheic dermatitis, unspecified: Secondary | ICD-10-CM | POA: Diagnosis not present

## 2023-05-17 DIAGNOSIS — Z1283 Encounter for screening for malignant neoplasm of skin: Secondary | ICD-10-CM

## 2023-05-17 DIAGNOSIS — L739 Follicular disorder, unspecified: Secondary | ICD-10-CM

## 2023-05-17 HISTORY — DX: Other benign neoplasm of skin, unspecified: D23.9

## 2023-05-17 MED ORDER — ADAPALENE 0.3 % EX GEL: CUTANEOUS | 6 refills | Status: AC

## 2023-05-17 MED ORDER — HYDROCORTISONE 2.5 % EX LOTN: TOPICAL_LOTION | CUTANEOUS | 6 refills | Status: AC

## 2023-05-17 NOTE — Patient Instructions (Addendum)
Biopsy Wound Care Instructions  Leave the original bandage on for 24 hours if possible.  If the bandage becomes soaked or soiled before that time, it is OK to remove it and examine the wound.  A small amount of post-operative bleeding is normal.  If excessive bleeding occurs, remove the bandage, place gauze over the site and apply continuous pressure (no peeking) over the area for 30 minutes. If this does not work, please call our clinic as soon as possible or page your doctor if it is after hours.   Once a day, cleanse the wound with soap and water. It is fine to shower. If a thick crust develops you may use a Q-tip dipped into dilute hydrogen peroxide (mix 1:1 with water) to dissolve it.  Hydrogen peroxide can slow the healing process, so use it only as needed.    After washing, apply petroleum jelly (Vaseline) or an antibiotic ointment if your doctor prescribed one for you, followed by a bandage.    For best healing, the wound should be covered with a layer of ointment at all times. If you are not able to keep the area covered with a bandage to hold the ointment in place, this may mean re-applying the ointment several times a day.  Continue this wound care until the wound has healed and is no longer open.   Itching and mild discomfort is normal during the healing process. However, if you develop pain or severe itching, please call our office.   If you have any discomfort, you can take Tylenol (acetaminophen) or ibuprofen as directed on the bottle. (Please do not take these if you have an allergy to them or cannot take them for another reason).  Some redness, tenderness and white or yellow material in the wound is normal healing.  If the area becomes very sore and red, or develops a thick yellow-green material (pus), it may be infected; please notify us.    If you have stitches, return to clinic as directed to have the stitches removed. You will continue wound care for 2-3 days after the stitches  are removed.   Wound healing continues for up to one year following surgery. It is not unusual to experience pain in the scar from time to time during the interval.  If the pain becomes severe or the scar thickens, you should notify the office.    A slight amount of redness in a scar is expected for the first six months.  After six months, the redness will fade and the scar will soften and fade.  The color difference becomes less noticeable with time.  If there are any problems, return for a post-op surgery check at your earliest convenience.  To improve the appearance of the scar, you can use silicone scar gel, cream, or sheets (such as Mederma or Serica) every night for up to one year. These are available over the counter (without a prescription).  Please call our office at [phone] for any questions or concerns.       Melanoma ABCDEs  Melanoma is the most dangerous type of skin cancer, and is the leading cause of death from skin disease.  You are more likely to develop melanoma if you: Have light-colored skin, light-colored eyes, or red or blond hair Spend a lot of time in the sun Tan regularly, either outdoors or in a tanning bed Have had blistering sunburns, especially during childhood Have a close family member who has had a melanoma Have atypical moles   or large birthmarks  Early detection of melanoma is key since treatment is typically straightforward and cure rates are extremely high if we catch it early.   The first sign of melanoma is often a change in a mole or a new dark spot.  The ABCDE system is a way of remembering the signs of melanoma.  A for asymmetry:  The two halves do not match. B for border:  The edges of the growth are irregular. C for color:  A mixture of colors are present instead of an even brown color. D for diameter:  Melanomas are usually (but not always) greater than 6mm - the size of a pencil eraser. E for evolution:  The spot keeps changing in  size, shape, and color.  Please check your skin once per month between visits. You can use a small mirror in front and a large mirror behind you to keep an eye on the back side or your body.   If you see any new or changing lesions before your next follow-up, please call to schedule a visit.  Please continue daily skin protection including broad spectrum sunscreen SPF 30+ to sun-exposed areas, reapplying every 2 hours as needed when you're outdoors.   Staying in the shade or wearing long sleeves, sun glasses (UVA+UVB protection) and wide brim hats (4-inch brim around the entire circumference of the hat) are also recommended for sun protection.      Due to recent changes in healthcare laws, you may see results of your pathology and/or laboratory studies on MyChart before the doctors have had a chance to review them. We understand that in some cases there may be results that are confusing or concerning to you. Please understand that not all results are received at the same time and often the doctors may need to interpret multiple results in order to provide you with the best plan of care or course of treatment. Therefore, we ask that you please give us 2 business days to thoroughly review all your results before contacting the office for clarification. Should we see a critical lab result, you will be contacted sooner.   If You Need Anything After Your Visit  If you have any questions or concerns for your doctor, please call our main line at [phone] and press option 4 to reach your doctor's medical assistant. If no one answers, please leave a voicemail as directed and we will return your call as soon as possible. Messages left after 4 pm will be answered the following business day.   You may also send us a message via MyChart. We typically respond to MyChart messages within 1-2 business days.  For prescription refills, please ask your pharmacy to contact our office. Our fax number is  [phone].  If you have an urgent issue when the clinic is closed that cannot wait until the next business day, you can page your doctor at the number below.    Please note that while we do our best to be available for urgent issues outside of office hours, we are not available 24/7.   If you have an urgent issue and are unable to reach us, you may choose to seek medical care at your doctor's office, retail clinic, urgent care center, or emergency room.  If you have a medical emergency, please immediately call 911 or go to the emergency department.  Pager Numbers  - Dr. Kowalski: [phone]  - Dr. Moye: [phone]  - Dr. Stewart: [phone]  In the event   of inclement weather, please call our main line at [phone] for an update on the status of any delays or closures.  Dermatology Medication Tips: Please keep the boxes that topical medications come in in order to help keep track of the instructions about where and how to use these. Pharmacies typically print the medication instructions only on the boxes and not directly on the medication tubes.   If your medication is too expensive, please contact our office at [phone] option 4 or send us a message through MyChart.   We are unable to tell what your co-pay for medications will be in advance as this is different depending on your insurance coverage. However, we may be able to find a substitute medication at lower cost or fill out paperwork to get insurance to cover a needed medication.   If a prior authorization is required to get your medication covered by your insurance company, please allow us 1-2 business days to complete this process.  Drug prices often vary depending on where the prescription is filled and some pharmacies may offer cheaper prices.  The website www.goodrx.com contains coupons for medications through different pharmacies. The prices here do not account for what the cost may be with help from  insurance (it may be cheaper with your insurance), but the website can give you the price if you did not use any insurance.  - You can print the associated coupon and take it with your prescription to the pharmacy.  - You may also stop by our office during regular business hours and pick up a GoodRx coupon card.  - If you need your prescription sent electronically to a different pharmacy, notify our office through [facility] MyChart or by phone at [phone] option 4.     Si Usted Necesita Algo Despus de Su Visita  Tambin puede enviarnos un mensaje a travs de MyChart. Por lo general respondemos a los mensajes de MyChart en el transcurso de 1 a 2 das hbiles.  Para renovar recetas, por favor pida a su farmacia que se ponga en contacto con nuestra oficina. Nuestro nmero de fax es el [phone].  Si tiene un asunto urgente cuando la clnica est cerrada y que no puede esperar hasta el siguiente da hbil, puede llamar/localizar a su doctor(a) al nmero que aparece a continuacin.   Por favor, tenga en cuenta que aunque hacemos todo lo posible para estar disponibles para asuntos urgentes fuera del horario de oficina, no estamos disponibles las 24 horas del da, los 7 das de la semana.   Si tiene un problema urgente y no puede comunicarse con nosotros, puede optar por buscar atencin mdica  en el consultorio de su doctor(a), en una clnica privada, en un centro de atencin urgente o en una sala de emergencias.  Si tiene una emergencia mdica, por favor llame inmediatamente al 911 o vaya a la sala de emergencias.  Nmeros de bper  - Dr. Kowalski: [phone]  - Dra. Moye: [phone]  - Dra. Stewart: [phone]  En caso de inclemencias del tiempo, por favor llame a nuestra lnea principal al [phone] para una actualizacin sobre el estado de cualquier retraso o cierre.  Consejos para la medicacin en dermatologa: Por favor, guarde las cajas en las que vienen los  medicamentos de uso tpico para ayudarle a seguir las instrucciones sobre dnde y cmo usarlos. Las farmacias generalmente imprimen las instrucciones del medicamento slo en las cajas y no directamente en los tubos del medicamento.   Si su medicamento   es muy caro, por favor, pngase en contacto con nuestra oficina llamando al [phone] y presione la opcin 4 o envenos un mensaje a travs de MyChart.   No podemos decirle cul ser su copago por los medicamentos por adelantado ya que esto es diferente dependiendo de la cobertura de su seguro. Sin embargo, es posible que podamos encontrar un medicamento sustituto a menor costo o llenar un formulario para que el seguro cubra el medicamento que se considera necesario.   Si se requiere una autorizacin previa para que su compaa de seguros cubra su medicamento, por favor permtanos de 1 a 2 das hbiles para completar este proceso.  Los precios de los medicamentos varan con frecuencia dependiendo del lugar de dnde se surte la receta y alguna farmacias pueden ofrecer precios ms baratos.  El sitio web www.goodrx.com tiene cupones para medicamentos de diferentes farmacias. Los precios aqu no tienen en cuenta lo que podra costar con la ayuda del seguro (puede ser ms barato con su seguro), pero el sitio web puede darle el precio si no utiliz ningn seguro.  - Puede imprimir el cupn correspondiente y llevarlo con su receta a la farmacia.  - Tambin puede pasar por nuestra oficina durante el horario de atencin regular y recoger una tarjeta de cupones de GoodRx.  - Si necesita que su receta se enve electrnicamente a una farmacia diferente, informe a nuestra oficina a travs de MyChart de [place] o por telfono llamando al [phone] y presione la opcin 4.  

## 2023-05-17 NOTE — Progress Notes (Signed)
Follow-Up Visit   Subjective  Matthew Davenport is a 42 y.o. male who presents for the following: Skin Cancer Screening and Upper Body Skin Exam  The patient presents for Upper Body Skin Exam (UBSE) for skin cancer screening and mole check. The patient has spots, moles and lesions to be evaluated, some may be new or changing and the patient has concerns that these could be cancer.   The following portions of the chart were reviewed this encounter and updated as appropriate: medications, allergies, medical history  Review of Systems:  No other skin or systemic complaints except as noted in HPI or Assessment and Plan.  Objective  Well appearing patient in no apparent distress; mood and affect are within normal limits.  All skin waist up examined. Relevant physical exam findings are noted in the Assessment and Plan.  Right Lower Back 0.7 cm brown changing mole         Assessment & Plan   Neoplasm of uncertain behavior Right Lower Back  Epidermal / dermal shaving  Lesion diameter (cm):  0.7 Informed consent: discussed and consent obtained   Timeout: patient name, date of birth, surgical site, and procedure verified   Procedure prep:  Patient was prepped and draped in usual sterile fashion Prep type:  Isopropyl alcohol Anesthesia: the lesion was anesthetized in a standard fashion   Anesthetic:  1% lidocaine w/ epinephrine 1-100,000 buffered w/ 8.4% NaHCO3 Instrument used: flexible razor blade   Hemostasis achieved with: pressure, aluminum chloride and electrodesiccation   Outcome: patient tolerated procedure well   Post-procedure details: sterile dressing applied and wound care instructions given   Dressing type: bandage and petrolatum    Specimen 1 - Surgical pathology Differential Diagnosis: Nevus r/o dysplasia   Check Margins: No  Nevus r/o dysplasia   Seborrheic dermatitis  Related Medications ketoconazole (NIZORAL) 2 % shampoo Shampoo into beard area let  sit 5-10 minutes then wash out. Use 3d/wk.  hydrocortisone 2.5 % lotion Apply to aa's beard QD 3d/wk PRN.  Acne vulgaris  Related Medications Adapalene (DIFFERIN) 0.3 % gel Apply to the back QHS.  Seborrheic dermatitis Face Clear at  exam   Chronic and persistent condition with duration or expected duration over one year. Condition is symptomatic / bothersome to patient. Not to goal. But improved Seborrheic Dermatitis  -  is a chronic persistent rash characterized by pinkness and scaling most commonly of the mid face but also can occur on the scalp (dandruff), ears; mid chest, mid back and groin.  It tends to be exacerbated by stress and cooler weather.  People who have neurologic disease may experience new onset or exacerbation of existing seborrheic dermatitis.  The condition is not curable but treatable and can be controlled.   Continue HC 2.5% lotion to aa's QD PRN up to 4 days per week.    FOLLICULITIS At face, chest, back  Exam: Perifollicular erythematous papules and pustules  Chronic and persistent condition with duration or expected duration over one year. Condition is symptomatic / bothersome to patient. Not to goal.  Exam:  Recommend Cln acne wash and Differin 0.3% gel QHS.   Lentigines, Seborrheic Keratoses, Hemangiomas - Benign normal skin lesions - Benign-appearing - Call for any changes  Melanocytic Nevi - Tan-brown and/or pink-flesh-colored symmetric macules and papules - Benign appearing on exam today - Observation - Call clinic for new or changing moles - Recommend daily use of broad spectrum spf 30+ sunscreen to sun-exposed areas.   Epidermal inclusion cyst  L cheek Benign-appearing. Exam most consistent with an epidermal inclusion cyst. Discussed that a cyst is a benign growth that can grow over time and sometimes get irritated or inflamed. Recommend observation if it is not bothersome. Discussed option of surgical excision to remove it if it is  growing, symptomatic, or other changes noted. Please call for new or changing lesions so they can be evaluated.   Spot treat with Differin 0.3% gel QHS.  Discussed IL steroid injection; I&D and excision options.  Actinic Damage - Chronic condition, secondary to cumulative UV/sun exposure - diffuse scaly erythematous macules with underlying dyspigmentation - Recommend daily broad spectrum sunscreen SPF 30+ to sun-exposed areas, reapply every 2 hours as needed.  - Staying in the shade or wearing long sleeves, sun glasses (UVA+UVB protection) and wide brim hats (4-inch brim around the entire circumference of the hat) are also recommended for sun protection.  - Call for new or changing lesions.  HISTORY OF DYSPLASTIC NEVUS Left superior pectoral and left upper quadrant abdomen 2019 No evidence of recurrence today Recommend regular full body skin exams Recommend daily broad spectrum sunscreen SPF 30+ to sun-exposed areas, reapply every 2 hours as needed.  Call if any new or changing lesions are noted between office visits   Skin cancer screening performed today.  Return in about 1 year (around 05/16/2024) for TBSE.  IAsher Muir, CMA, am acting as scribe for Armida Sans, MD.   Documentation: I have reviewed the above documentation for accuracy and completeness, and I agree with the above.  Armida Sans, MD

## 2023-05-19 ENCOUNTER — Encounter: Payer: Self-pay | Admitting: Dermatology

## 2023-05-22 ENCOUNTER — Telehealth: Payer: Self-pay

## 2023-05-22 NOTE — Telephone Encounter (Signed)
Called pt to discuss biopsy results, pt requested I discuss biopsy results with his mom who was also on the phone, discussed biopsy results with pt and his mom today.

## 2023-08-13 ENCOUNTER — Emergency Department
Admission: EM | Admit: 2023-08-13 | Discharge: 2023-08-13 | Disposition: A | Discharge status: Home / Self Care | Payer: Medicaid Other | Attending: Emergency Medicine | Admitting: Emergency Medicine

## 2023-08-13 ENCOUNTER — Other Ambulatory Visit: Payer: Self-pay

## 2023-08-13 DIAGNOSIS — N390 Urinary tract infection, site not specified: Secondary | ICD-10-CM | POA: Diagnosis not present

## 2023-08-13 DIAGNOSIS — R509 Fever, unspecified: Secondary | ICD-10-CM | POA: Diagnosis present

## 2023-08-13 LAB — URINALYSIS, ROUTINE W REFLEX MICROSCOPIC
Bilirubin Urine: NEGATIVE
Glucose, UA: NEGATIVE mg/dL
Hgb urine dipstick: NEGATIVE
Ketones, ur: NEGATIVE mg/dL
Nitrite: NEGATIVE
Protein, ur: NEGATIVE mg/dL
Specific Gravity, Urine: 1.01 (ref 1.005–1.030)
pH: 5 (ref 5.0–8.0)

## 2023-08-13 LAB — BASIC METABOLIC PANEL
Anion gap: 11 (ref 5–15)
BUN: 12 mg/dL (ref 6–20)
CO2: 24 mmol/L (ref 22–32)
Calcium: 8.9 mg/dL (ref 8.9–10.3)
Chloride: 101 mmol/L (ref 98–111)
Creatinine, Ser: 0.42 mg/dL — ABNORMAL LOW (ref 0.61–1.24)
GFR, Estimated: >60 mL/min (ref >60)
Glucose, Bld: 97 mg/dL (ref 70–99)
Potassium: 3.9 mmol/L (ref 3.5–5.1)
Sodium: 136 mmol/L (ref 135–145)

## 2023-08-13 LAB — CBC
HCT: 39.5 % (ref 39.0–52.0)
Hemoglobin: 12.8 g/dL — ABNORMAL LOW (ref 13.0–17.0)
MCH: 28.5 pg (ref 26.0–34.0)
MCHC: 32.4 g/dL (ref 30.0–36.0)
MCV: 88 fL (ref 80.0–100.0)
Platelets: 229 10*3/uL (ref 150–400)
RBC: 4.49 MIL/uL (ref 4.22–5.81)
RDW: 13.2 % (ref 11.5–15.5)
WBC: 5.6 10*3/uL (ref 4.0–10.5)
nRBC: 0 % (ref 0.0–0.2)

## 2023-08-13 MED ORDER — LEVOFLOXACIN 750 MG PO TABS: 750.0000 mg | ORAL_TABLET | Freq: Every day | ORAL | 0 refills | Status: AC

## 2023-08-13 MED ORDER — LEVOFLOXACIN 750 MG PO TABS
750.0000 mg | ORAL_TABLET | Freq: Once | ORAL | Status: AC
  Administered 2023-08-13: 750 mg via ORAL
  Filled 2023-08-13: qty 1

## 2023-08-13 NOTE — Discharge Instructions (Addendum)
You are seen in the emergency department and diagnosed with a urinary tract infection.  Concerned that your urine is complicated and could be resistant to multiple antibiotics.  Your urine was sent for culture today.  You are given your first dose of levofloxacin, take your second dose tomorrow.  If you have ongoing symptoms or fever you need to return immediately to the emergency department for IV antibiotics.  You will be notified in the next 2 days if your urine was not covered by the antibiotics that we started you on and if it is not covered you will need to return for IV antibiotics.  Thank you for choosing [place] for your health care, it was my pleasure to care for you today!  Corena Herter, MD

## 2023-08-13 NOTE — ED Triage Notes (Signed)
Pt comes with recurrent UTI. Pt was given meds with no relief. Pt was advised to come to ED for IV meds. Pt states he did have fever last night.

## 2023-08-13 NOTE — ED Provider Notes (Signed)
[facility] Provider Note    Event Date/Time   First MD Initiated Contact with Patient 08/13/23 1328     (approximate)   History   UTI   HPI  Matthew Davenport is a 42 y.o. 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  Multiple urinary tract infections in the past.  States that they are usually treated with ciprofloxacin or Bactrim.  1 month ago had a urinary tract infection that was treated with ciprofloxacin for 10 days.  Stated that that improved his symptoms and then he started again having symptoms of a UTI that started last night.  States that he was told by his primary care provider who his office just closed that he had a resistant urinary tract infection and needed IV antibiotics.  Patient was adamant that he did not want to be admitted for IV antibiotics.  Denies fever.     Physical Exam   Triage Vital Signs: ED Triage Vitals [08/13/23 1224]  Encounter Vitals Group     BP 97/61     Systolic BP Percentile      Diastolic BP Percentile      Pulse Rate 78     Resp 19     Temp 98 F (36.7 C)     Temp src      SpO2 100 %     Weight      Height      Head Circumference      Peak Flow      Pain Score      Pain Loc      Pain Education      Exclude from Growth Chart     Most recent vital signs: Vitals:   08/13/23 1224  BP: 97/61  Pulse: 78  Resp: 19  Temp: 98 F (36.7 C)  SpO2: 100%    Physical Exam Constitutional:      Appearance: He is well-developed.     Comments: Sitting in a wheelchair, mother at bedside  HENT:     Head: Atraumatic.  Eyes:     Conjunctiva/sclera: Conjunctivae normal.  Cardiovascular:     Rate and Rhythm: Regular rhythm.  Pulmonary:     Effort: No respiratory distress.  Abdominal:     General:  Abdomen is flat.     Tenderness: There is no abdominal tenderness. There is no right CVA tenderness or left CVA tenderness.  Musculoskeletal:        General: Normal range of motion.     Cervical back: Normal range of motion.  Skin:    General: Skin is warm.  Neurological:     Mental Status: He is alert. Mental status is at baseline.     IMPRESSION / MDM / ASSESSMENT AND PLAN / ED COURSE  I reviewed the triage vital signs and the nursing notes.  On chart review no resistant urinary tract infections on urine culture that I can see in my evaluation  Large amount of paperwork with the patient printed out from primary care physician office.  Urine culture in the past on the sheet no recent culture from the past 2 to 3 years.  Does have a history of Pseudomonas that has been sensitive to fluoroquinolones in the past.   LABS (all labs ordered are listed, but only abnormal results are displayed)  Labs interpreted as -    Labs Reviewed  CBC - Abnormal; Notable for the following components:      Result Value   Hemoglobin 12.8 (*)    All other components within normal limits  BASIC METABOLIC PANEL - Abnormal; Notable for the following components:   Creatinine, Ser 0.42 (*)    All other components within normal limits  URINALYSIS, ROUTINE W REFLEX MICROSCOPIC - Abnormal; Notable for the following components:   Color, Urine YELLOW (*)    APPearance CLEAR (*)    Leukocytes,Ua SMALL (*)    Bacteria, UA MANY (*)    All other components within normal limits  URINE CULTURE     MDM  Lab work with no significant leukocytosis.  Creatinine at baseline with no significant electrolyte abnormalities.  Urine consistent with urinary tract infection.  Discussed the patient's case with the pharmacist on-call.  Based on prior sensitivity data and concerning phone call from primary care physician about needing IV antibiotics discussed with the patient that I recommended him be admitted for IV  antibiotics given his concern for possible ESBL.  Patient expressed understanding but stated that he wanted to do a trial of antibiotics to go home with.  Given first dose of levofloxacin in the emergency department.  Given a prescription for levofloxacin to go home with.  Discussed that if he had a fever or any ongoing symptoms for the next 1 to 2 days he needed to return to the emergency department for IV antibiotics.  Discussed that he would be notified if his culture data was not covered by levofloxacin and that he would need to return for IV antibiotics given concern for ESBL.  Patient and his mother expressed understanding and stated that they would return.  Given information to follow-up with her primary care physician and establish care.  Given information to follow-up with urology given his recurrent episodes of resistant urinary tract infections.     PROCEDURES:  Critical Care performed: No  Procedures  Patient's presentation is most consistent with acute presentation with potential threat to life or bodily function.   MEDICATIONS ORDERED IN ED: Medications  levofloxacin (LEVAQUIN) tablet 750 mg (750 mg Oral Given 08/13/23 1643)    FINAL CLINICAL IMPRESSION(S) / ED DIAGNOSES   Final diagnoses:  Complicated UTI (urinary tract infection)     Rx / DC Orders   ED Discharge Orders          Ordered    Ambulatory Referral to Primary Care (Establish Care)        08/13/23 1637    levofloxacin (LEVAQUIN) 750 MG tablet  Daily        08/13/23 1637    Ambulatory Referral to Primary Care (Establish Care)        08/13/23 1641             Note:  This document was prepared using Dragon voice recognition software and may include unintentional dictation errors.   Corena Herter, MD 08/13/23 1650

## 2023-08-13 NOTE — ED Notes (Signed)
Patient self cath for urine sample

## 2023-08-13 NOTE — ED Notes (Signed)
Visitor at bedside. Pt requesting to self cath for ordered UA and to use his own supplies. This RN offered assistance, pt does not want. Reports its easier to do it himself and use home supplies.

## 2023-08-13 NOTE — ED Notes (Signed)
Patient declined discharge vital signs. 

## 2023-08-13 NOTE — ED Notes (Signed)
Lab called to add on urine culture.  ?

## 2023-08-15 LAB — URINE CULTURE: Culture: >=100000 — AB

## 2023-12-13 ENCOUNTER — Ambulatory Visit: Payer: Medicaid Other | Admitting: Family Medicine

## 2023-12-13 ENCOUNTER — Telehealth: Payer: Self-pay | Admitting: Family Medicine

## 2023-12-13 ENCOUNTER — Ambulatory Visit: Payer: Self-pay | Admitting: Internal Medicine

## 2023-12-13 DIAGNOSIS — G8220 Paraplegia, unspecified: Secondary | ICD-10-CM

## 2023-12-13 MED ORDER — APIXABAN 5 MG PO TABS: 5.0000 mg | ORAL_TABLET | Freq: Two times a day (BID) | ORAL | 5 refills | Status: DC

## 2023-12-13 NOTE — Telephone Encounter (Signed)
  Chief Complaint: Med refill Disposition: [] 911 / [] ED /[] Urgent Care (no appt availability in office) / [] Appointment(In office/virtual)/ []  [place] [facility]/ [] Home Care/ [] Refused Recommended Disposition /[] [place] [facility]/ [x]  Follow-up with PCP Additional Notes: Pt reporting that he needing 8 doses minimum of his Eliquis to make it to "new" pt appt on 2/3, he will be taking his last available pill on 1/26. Pt reporting that he was unable to make it to appt today because of "solid ice" [place]." Pt also reporting that Dr. Girtha Rm has been his doc as recently as August 2024, care was "under Dr. Girtha Rm at [facility] in [place], under her for last year and a half or so" and she "gave prescriptions in August" to pt but [facility] shut down and Dr. Girtha Rm transferred to new office. Pt reporting that they "were trying to do virtual appt but Dr. Girtha Rm doesn't do those with new patients." Informed pt that nurse would send HP message to office with request for meds in meantime until appt. Please advise pt.  Reason for Disposition . [1] Prescription refill request for ESSENTIAL medicine (i.e., likelihood of harm to patient if not taken) AND [2] triager unable to refill per department policy  Answer Assessment - Initial Assessment Questions 1. DRUG NAME: "What medicine do you need to have refilled?"        Pt needing 8 doses of Eliquis to make it to appt on 2/3, taking last available pill they have on 1/26.  Answer Assessment - Initial Assessment Questions 1. REASON FOR CALL or QUESTION: "What is your reason for calling today?" or "How can I best help you?" or "What question do you have that I can help answer?"     Pt needing 8 doses of Eliquis to make it to appt on 2/3, taking last available pill they have on 1/26.  Protocols used: Information Only Call - No Triage-A-AH, Medication Refill and Renewal Call-A-AH

## 2023-12-13 NOTE — Telephone Encounter (Signed)
   Copied from CRM [id number]. Topic: Clinical - Medication Refill >> Dec 13, 2023  8:50 AM Ivette P wrote: Most Recent Primary Care Visit:   Medication: apixaban (ELIQUIS) 5 MG TABS tablet baclofen (LIORESAL) 10 MG tablet baclofen (LIORESAL) 10 MG tablet baclofen (LIORESAL) 20 MG tablet   Has the patient contacted their pharmacy? No (Agent: If no, request that the patient contact the pharmacy for the refill. If patient does not wish to contact the pharmacy document the reason why and proceed with request.) (Agent: If yes, when and what did the pharmacy advise?)  Is this the correct pharmacy for this prescription? Yes If no, delete pharmacy and type the correct one.  This is the patient's preferred pharmacy:  [facility] #[id number] - [place], [place] - [address] AT [facility] OF [address] [place] [address] [place] [address] Phone: [phone] Fax: [phone]   Has the prescription been filled recently? No  Is the patient out of the medication? No, Pt has medication until Monday 01/27. Requesting an extension until next available appt   Has the patient been seen for an appointment in the last year OR does the patient have an upcoming appointment? Yes  Can we respond through MyChart? No, Pt is still setting up Mychart.   Agent: Please be advised that Rx refills may take up to 3 business days. We ask that you follow-up with your pharmacy.

## 2023-12-13 NOTE — Telephone Encounter (Signed)
Spoke with patient's mother and let her know the medication was sent in for them. She will call back if any further needs/questions arise.

## 2023-12-13 NOTE — Addendum Note (Signed)
Addended by: Alease Medina on: 12/13/2023 03:44 PM   Modules accepted: Orders

## 2023-12-13 NOTE — Telephone Encounter (Unsigned)
Copied from CRM [id number]. Topic: Clinical - Medication Refill >> Dec 13, 2023  8:50 AM Ivette P wrote: Most Recent Primary Care Visit:   Medication: apixaban (ELIQUIS) 5 MG TABS tablet baclofen (LIORESAL) 10 MG tablet baclofen (LIORESAL) 10 MG tablet baclofen (LIORESAL) 20 MG tablet   Has the patient contacted their pharmacy? No (Agent: If no, request that the patient contact the pharmacy for the refill. If patient does not wish to contact the pharmacy document the reason why and proceed with request.) (Agent: If yes, when and what did the pharmacy advise?)  Is this the correct pharmacy for this prescription? Yes If no, delete pharmacy and type the correct one.  This is the patient's preferred pharmacy:  [facility] #[id number] - [place], [place] - [address] AT [facility] OF [address] [place] [address] [place] [address] Phone: [phone] Fax: [phone]   Has the prescription been filled recently? No  Is the patient out of the medication? No, Pt has medication until Monday 01/27. Requesting an extension until next available appt   Has the patient been seen for an appointment in the last year OR does the patient have an upcoming appointment? Yes  Can we respond through MyChart? No, Pt is still setting up Mychart.   Agent: Please be advised that Rx refills may take up to 3 business days. We ask that you follow-up with your pharmacy.

## 2023-12-25 ENCOUNTER — Ambulatory Visit (INDEPENDENT_AMBULATORY_CARE_PROVIDER_SITE_OTHER): Payer: Medicaid Other | Admitting: Family Medicine

## 2023-12-25 ENCOUNTER — Encounter: Payer: Self-pay | Admitting: Family Medicine

## 2023-12-25 VITALS — BP 93/63 | HR 81 | Temp 97.6°F | Resp 14 | Ht 73.0 in | Wt 145.0 lb

## 2023-12-25 DIAGNOSIS — E538 Deficiency of other specified B group vitamins: Secondary | ICD-10-CM

## 2023-12-25 DIAGNOSIS — M25531 Pain in right wrist: Secondary | ICD-10-CM | POA: Diagnosis not present

## 2023-12-25 DIAGNOSIS — E559 Vitamin D deficiency, unspecified: Secondary | ICD-10-CM | POA: Diagnosis not present

## 2023-12-25 DIAGNOSIS — G8220 Paraplegia, unspecified: Secondary | ICD-10-CM | POA: Diagnosis not present

## 2023-12-25 DIAGNOSIS — F419 Anxiety disorder, unspecified: Secondary | ICD-10-CM

## 2023-12-25 DIAGNOSIS — N390 Urinary tract infection, site not specified: Secondary | ICD-10-CM | POA: Diagnosis not present

## 2023-12-25 DIAGNOSIS — G8929 Other chronic pain: Secondary | ICD-10-CM

## 2023-12-25 MED ORDER — CIPROFLOXACIN HCL 500 MG PO TABS: 500.0000 mg | ORAL_TABLET | Freq: Two times a day (BID) | ORAL | 0 refills | Status: AC

## 2023-12-25 NOTE — Progress Notes (Signed)
Established Patient Office Visit  Subjective   Patient ID: Matthew Davenport, male    DOB: 23-Oct-1981  Age: 43 y.o. MRN: [id number]  Chief Complaint  Patient presents with   Establish Care   Medical Management of Chronic Issues    HPI Matthew Davenport is a 43 year old quadriplegic with neurogenic bladder and recurrent UTIs.  He in and out caths 3 times a day for residual volume and uses a condom cath between.  He needs a letter of medical necessity today to help him get his supplies paid for by insurance company.  Despite the caution of using a sterile catheter at each catheterization he did have a bladder infection in November.  Went to the ER where he was observed for a number of hours.  His vital signs were stable so was not admitted to the hospital.  He was treated with the levofloxacin. He admits she is having a lot of anxiety and worries about small things.  It is difficult for him to sleep because of this.  He has never taken any medication for anxiety or depression.  PHQ-9 is 4, GAD-7 is 14 Offered him an influenza vaccine and he declined. His right wrist and elbow are chronically painful.  This seems to be an overuse injury. Having problems with anxiety.  Interferes with sleeping.  Worries over small things.        ROS    Objective:     BP 93/63 (BP Location: Right Arm, Patient Position: Sitting, Cuff Size: Normal)   Pulse 81   Temp 97.6 F (36.4 C) (Other (Comment))   Resp 14   Ht 6\' 1"  (1.854 m) Comment: per patient  Wt 145 lb (65.8 kg) Comment: per patient  SpO2 95%   BMI 19.13 kg/m    Physical Exam Vitals and nursing note reviewed.  Constitutional:      Appearance: Normal appearance.  HENT:     Head: Normocephalic and atraumatic.  Eyes:     Conjunctiva/sclera: Conjunctivae normal.  Cardiovascular:     Rate and Rhythm: Normal rate and regular rhythm.  Pulmonary:     Effort: Pulmonary effort is normal.     Breath sounds: Normal breath sounds.   Musculoskeletal:     Right lower leg: No edema.     Left lower leg: No edema.  Skin:    General: Skin is warm and dry.  Neurological:     Mental Status: He is alert and oriented to person, place, and time.  Psychiatric:        Mood and Affect: Mood normal.        Behavior: Behavior normal.        Thought Content: Thought content normal.        Judgment: Judgment normal.         No results found for any visits on 12/25/23.    The ASCVD Risk score (Arnett DK, et al., 2019) failed to calculate for the following reasons:   Cannot find a previous HDL lab   Cannot find a previous total cholesterol lab    Assessment & Plan:  Paraplegia following spinal cord injury (HCC) -     CBC with Differential/Platelet -     CMP14+EGFR -     Lipid panel -     Iron, TIBC and Ferritin Panel  Vitamin D deficiency -     VITAMIN D 25 Hydroxy (Vit-D Deficiency, Fractures)  B12 deficiency -     B12 and Folate Panel  Chronic  pain of right wrist Assessment & Plan: Discussed Salonpas or menthol, camphor, methyl salicylate.   Frequent UTI Assessment & Plan: He has a standing prescription for Cipro so that he develops symptoms over the weekend he can address some without continuing to wait.   Other orders -     Ciprofloxacin HCl; Take 1 tablet (500 mg total) by mouth 2 (two) times daily for 10 days.  Dispense: 20 tablet; Refill: 0     Return in about 6 months (around 06/23/2024).    Alease Medina, MD

## 2023-12-25 NOTE — Assessment & Plan Note (Signed)
Discussed Salonpas or menthol, camphor, methyl salicylate.

## 2023-12-25 NOTE — Assessment & Plan Note (Signed)
He has a standing prescription for Cipro so that he develops symptoms over the weekend he can address some without continuing to wait.

## 2023-12-26 ENCOUNTER — Encounter: Payer: Self-pay | Admitting: Family Medicine

## 2023-12-26 DIAGNOSIS — F419 Anxiety disorder, unspecified: Secondary | ICD-10-CM | POA: Insufficient documentation

## 2023-12-26 MED ORDER — FLUOXETINE HCL 20 MG PO TABS: 20.0000 mg | ORAL_TABLET | Freq: Every day | ORAL | 3 refills | Status: DC

## 2023-12-26 NOTE — Addendum Note (Signed)
Addended by: Alease Medina on: 12/26/2023 08:08 AM   Modules accepted: Orders

## 2023-12-26 NOTE — Assessment & Plan Note (Signed)
Having problems with anxiety.Trial of Prozac.

## 2023-12-27 ENCOUNTER — Telehealth: Payer: Self-pay

## 2023-12-27 DIAGNOSIS — F419 Anxiety disorder, unspecified: Secondary | ICD-10-CM

## 2023-12-27 MED ORDER — FLUOXETINE HCL 20 MG PO CAPS: 20.0000 mg | ORAL_CAPSULE | Freq: Every day | ORAL | 3 refills | Status: AC

## 2023-12-27 NOTE — Telephone Encounter (Signed)
 Pharmacy stated insurance prefers capsules, verbal ok given to change per Dr. Ziglar.

## 2024-01-02 LAB — POCT URINALYSIS DIP (CLINITEK)
Bilirubin, UA: NEGATIVE
Blood, UA: NEGATIVE
Glucose, UA: NEGATIVE mg/dL
Ketones, POC UA: NEGATIVE mg/dL
Leukocytes, UA: NEGATIVE
Nitrite, UA: NEGATIVE
POC PROTEIN,UA: NEGATIVE
Spec Grav, UA: 1.01 (ref 1.010–1.025)
Urobilinogen, UA: 0.2 U/dL
pH, UA: 5.5 (ref 5.0–8.0)

## 2024-01-02 NOTE — Progress Notes (Signed)
Patient came in 01/02/2024 for lab draw

## 2024-01-02 NOTE — Progress Notes (Signed)
POCT UA added per Dr. Jerald Kief verbal order when patient came in for fasting lab draw today.

## 2024-01-02 NOTE — Addendum Note (Signed)
Addended by: Benay Pike on: 01/02/2024 11:11 AM   Modules accepted: Orders

## 2024-01-03 ENCOUNTER — Encounter: Payer: Self-pay | Admitting: Family Medicine

## 2024-01-03 LAB — CMP14+EGFR
ALT: 15 [IU]/L (ref 0–44)
AST: 12 [IU]/L (ref 0–40)
Albumin: 4.2 g/dL (ref 4.1–5.1)
Alkaline Phosphatase: 47 [IU]/L (ref 44–121)
BUN/Creatinine Ratio: 26 — ABNORMAL HIGH (ref 9–20)
BUN: 13 mg/dL (ref 6–24)
Bilirubin Total: 0.4 mg/dL (ref 0.0–1.2)
CO2: 23 mmol/L (ref 20–29)
Calcium: 9 mg/dL (ref 8.7–10.2)
Chloride: 105 mmol/L (ref 96–106)
Creatinine, Ser: 0.5 mg/dL — ABNORMAL LOW (ref 0.76–1.27)
Globulin, Total: 2.9 g/dL (ref 1.5–4.5)
Glucose: 92 mg/dL (ref 70–99)
Potassium: 4.2 mmol/L (ref 3.5–5.2)
Sodium: 140 mmol/L (ref 134–144)
Total Protein: 7.1 g/dL (ref 6.0–8.5)
eGFR: 132 mL/min/{1.73_m2} (ref >59)

## 2024-01-03 LAB — CBC WITH DIFFERENTIAL/PLATELET
Basophils Absolute: 0.1 10*3/uL (ref 0.0–0.2)
Basos: 1 %
EOS (ABSOLUTE): 0.1 10*3/uL (ref 0.0–0.4)
Eos: 1 %
Hematocrit: 38.9 % (ref 37.5–51.0)
Hemoglobin: 12.7 g/dL — ABNORMAL LOW (ref 13.0–17.7)
Immature Grans (Abs): 0 10*3/uL (ref 0.0–0.1)
Immature Granulocytes: 0 %
Lymphocytes Absolute: 1.9 10*3/uL (ref 0.7–3.1)
Lymphs: 22 %
MCH: 28.9 pg (ref 26.6–33.0)
MCHC: 32.6 g/dL (ref 31.5–35.7)
MCV: 89 fL (ref 79–97)
Monocytes Absolute: 0.6 10*3/uL (ref 0.1–0.9)
Monocytes: 8 %
Neutrophils Absolute: 5.8 10*3/uL (ref 1.4–7.0)
Neutrophils: 68 %
Platelets: 216 10*3/uL (ref 150–450)
RBC: 4.39 x10E6/uL (ref 4.14–5.80)
RDW: 13.1 % (ref 11.6–15.4)
WBC: 8.5 10*3/uL (ref 3.4–10.8)

## 2024-01-03 LAB — B12 AND FOLATE PANEL
Folate: 8 ng/mL (ref >3.0)
Vitamin B-12: 405 pg/mL (ref 232–1245)

## 2024-01-03 LAB — LIPID PANEL
Chol/HDL Ratio: 3.2 {ratio} (ref 0.0–5.0)
Cholesterol, Total: 146 mg/dL (ref 100–199)
HDL: 46 mg/dL (ref >39)
LDL Chol Calc (NIH): 89 mg/dL (ref 0–99)
Triglycerides: 50 mg/dL (ref 0–149)
VLDL Cholesterol Cal: 11 mg/dL (ref 5–40)

## 2024-01-03 LAB — IRON,TIBC AND FERRITIN PANEL
Ferritin: 240 ng/mL (ref 30–400)
Iron Saturation: 25 % (ref 15–55)
Iron: 73 ug/dL (ref 38–169)
Total Iron Binding Capacity: 292 ug/dL (ref 250–450)
UIBC: 219 ug/dL (ref 111–343)

## 2024-01-03 LAB — VITAMIN D 25 HYDROXY (VIT D DEFICIENCY, FRACTURES): Vit D, 25-Hydroxy: 22.5 ng/mL — ABNORMAL LOW (ref 30.0–100.0)

## 2024-02-05 ENCOUNTER — Other Ambulatory Visit: Payer: Self-pay | Admitting: Family Medicine

## 2024-02-05 NOTE — Telephone Encounter (Signed)
 Copied from CRM [id number]. Topic: Clinical - Medication Refill >> Feb 05, 2024  3:21 PM Gaetano Hawthorne wrote: Most Recent Primary Care Visit:  Provider: Alease Medina  Department: [facility]  Visit Type: NEW PATIENT  Date: 12/25/2023  Medication: dutasteride (AVODART) 0.5 MG capsule  Has the patient contacted their pharmacy? Yes, they advised that it was denied [facility] Agent did not see any evidence of that in his chart (Agent: If no, request that the patient contact the pharmacy for the refill. If patient does not wish to contact the pharmacy document the reason why and proceed with request.) (Agent: If yes, when and what did the pharmacy advise?)  Is this the correct pharmacy for this prescription? Yes If no, delete pharmacy and type the correct one.  This is the patient's preferred pharmacy:  [facility] #[id number] - [place], [place] - [address] AT [facility] OF [address] [place] [address] [place] [address] Phone: [phone] Fax: [phone]   Has the prescription been filled recently? No  Is the patient out of the medication? No, however, he only has one more pill for tomorrow  Has the patient been seen for an appointment in the last year OR does the patient have an upcoming appointment? Yes  Can we respond through MyChart? Yes  Agent: Please be advised that Rx refills may take up to 3 business days. We ask that you follow-up with your pharmacy.

## 2024-02-05 NOTE — Telephone Encounter (Signed)
 Last Fill: Unknown  Last OV: 12/25/23 Next OV: 06/24/24  Routing to provider for review/authorization.

## 2024-02-06 MED ORDER — DUTASTERIDE 0.5 MG PO CAPS: 0.5000 mg | ORAL_CAPSULE | Freq: Every day | ORAL | 1 refills | Status: DC

## 2024-03-20 ENCOUNTER — Other Ambulatory Visit: Payer: Self-pay | Admitting: Family Medicine

## 2024-03-20 NOTE — Telephone Encounter (Signed)
 Copied from CRM [id number]. Topic: Clinical - Medication Refill >> Mar 20, 2024  4:36 PM Star East wrote: Most Recent Primary Care Visit:  Provider: ZIGLAR, SUSAN K  Department: [facility]  Visit Type: NEW PATIENT  Date: 12/25/2023  Medication: baclofen  (LIORESAL ) 10 MG tablet  baclofen  (LIORESAL ) 20 MG tablet   Has the patient contacted their pharmacy? Yes (Agent: If no, request that the patient contact the pharmacy for the refill. If patient does not wish to contact the pharmacy document the reason why and proceed with request.) (Agent: If yes, when and what did the pharmacy advise?)  Is this the correct pharmacy for this prescription? Yes If no, delete pharmacy and type the correct one.  This is the patient's preferred pharmacy:  [id number] - [place], [place] - [address] AT [facility] OF [address] [place] [address] [place] [address] Phone: [phone] Fax: [phone]   Has the prescription been filled recently? No  Is the patient out of the medication? Yes  Has the patient been seen for an appointment in the last year OR does the patient have an upcoming appointment? Yes  Can we respond through MyChart? Yes  Agent: Please be advised that Rx refills may take up to 3 business days. We ask that you follow-up with your pharmacy.

## 2024-03-22 ENCOUNTER — Other Ambulatory Visit: Payer: Self-pay | Admitting: Family Medicine

## 2024-03-22 ENCOUNTER — Telehealth: Payer: Self-pay | Admitting: Family Medicine

## 2024-03-22 MED ORDER — BACLOFEN 10 MG PO TABS: 10.0000 mg | ORAL_TABLET | Freq: Four times a day (QID) | ORAL | 3 refills | Status: DC

## 2024-03-22 MED ORDER — BACLOFEN 20 MG PO TABS: 20.0000 mg | ORAL_TABLET | Freq: Four times a day (QID) | ORAL | 3 refills | Status: DC

## 2024-03-22 NOTE — Telephone Encounter (Signed)
 Called and spoke with patient and Mother.  He takes 30mg  of baclofen  QID.  It takes a 20mg  and a 10mg  for this.  Needs refills on both.

## 2024-03-22 NOTE — Telephone Encounter (Signed)
 Mother walked in to request refill of:  Name of Medication(s):  Baclofen  10 mg Last date of OV:  12/25/23 Pharmacy:  [facility] [address] [place]   Will route refill request to [facility].  Discussed with patient policy to call pharmacy for future refills.  Also, discussed refills may take up to 48 hours to approve or deny.  Matthew Davenport

## 2024-05-16 ENCOUNTER — Ambulatory Visit: Payer: Medicaid Other | Admitting: Dermatology

## 2024-06-24 ENCOUNTER — Ambulatory Visit (INDEPENDENT_AMBULATORY_CARE_PROVIDER_SITE_OTHER): Payer: Medicaid Other | Admitting: Family Medicine

## 2024-06-24 ENCOUNTER — Encounter: Payer: Self-pay | Admitting: Family Medicine

## 2024-06-24 VITALS — BP 89/61 | HR 70 | Resp 16 | Ht 73.0 in | Wt 145.0 lb

## 2024-06-24 DIAGNOSIS — G834 Cauda equina syndrome: Secondary | ICD-10-CM | POA: Insufficient documentation

## 2024-06-24 DIAGNOSIS — M542 Cervicalgia: Secondary | ICD-10-CM | POA: Insufficient documentation

## 2024-06-24 DIAGNOSIS — N319 Neuromuscular dysfunction of bladder, unspecified: Secondary | ICD-10-CM | POA: Insufficient documentation

## 2024-06-24 DIAGNOSIS — G8929 Other chronic pain: Secondary | ICD-10-CM

## 2024-06-24 DIAGNOSIS — N390 Urinary tract infection, site not specified: Secondary | ICD-10-CM | POA: Diagnosis not present

## 2024-06-24 DIAGNOSIS — M25531 Pain in right wrist: Secondary | ICD-10-CM

## 2024-06-24 DIAGNOSIS — E559 Vitamin D deficiency, unspecified: Secondary | ICD-10-CM | POA: Insufficient documentation

## 2024-06-24 LAB — POCT URINALYSIS DIPSTICK
Bilirubin, UA: NEGATIVE
Blood, UA: NEGATIVE
Glucose, UA: NEGATIVE
Ketones, UA: NEGATIVE
Leukocytes, UA: NEGATIVE
Nitrite, UA: NEGATIVE
Protein, UA: NEGATIVE
Spec Grav, UA: 1.02 (ref 1.010–1.025)
Urobilinogen, UA: 0.2 U/dL
pH, UA: 5.5 (ref 5.0–8.0)

## 2024-06-24 MED ORDER — BACLOFEN 10 MG PO TABS: 10.0000 mg | ORAL_TABLET | Freq: Four times a day (QID) | ORAL | 3 refills | Status: DC

## 2024-06-24 NOTE — Assessment & Plan Note (Signed)
 Has been taking vitamin D  5000 international units daily.  Will check his vitamin D  level.

## 2024-06-24 NOTE — Assessment & Plan Note (Signed)
 Already doing physical therapy, taking baclofen  30 mg 4 times daily and massage.  Still has pain and decreased range of motion.  Will check neck films.  May need to check MRI of cervical spine.

## 2024-06-24 NOTE — Assessment & Plan Note (Signed)
 Urinalysis is completely normal today.  The difference is that he started taking cranberry pills.  This may be working for him.  Please continue will send urine to culture just for reassurance.

## 2024-06-24 NOTE — Assessment & Plan Note (Signed)
 Does In-N-Out cath 3 times a day for residual volume and uses condom cath in between.

## 2024-06-24 NOTE — Progress Notes (Signed)
 Established Patient Office Visit  Subjective   Patient ID: Matthew Davenport, male    DOB: [birth year]  Age: 43 y.o. MRN: [id number]  Chief Complaint  Patient presents with   Follow-up    HPI Matthew Davenport is a delightful 43 year old quadriplegic with neurogenic bladder and recurrent UTIs.  (He in and out caths 3 times a day for residual volume and uses a condom cath between.)  He has neck pain.  He cannot turn his head to the right since April or May.  He started doing physical therapy by lifting weights and doing range of motion.  That has helped some but he still has pain.  He also has a vibration massage equipment that helps some.  He takes baclofen  20 mg +10 mg 4 times daily and that helps some.  He reports pain in his right wrist that he has all the time.  Tried Salonpas and it had no effect at all.  His mother gives him Tylenol  and has little effect.  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  The marijuana helps the wrist pain and his neck pain.  He has not had a bladder infection since January.  In January he started taking cranberry pills and they may be working.  His UA today is clear but will send his urine for culture for reassurance.  He was having some issues with anxiety so we did a trial of Prozac .  He would wake up in the morning and was having shaking of his abdominal muscles.  He could barely tell a difference in how he felt emotionally with the Prozac  so he just stopped taking it.  Within a few days of stopping the Prozac ,  the abdominal shaking stopped.  He has been taking vitamin D3 5000mcg daily.  We need to check his vitamin D  level .       Objective:     BP (!) 89/61   Pulse 70   Resp 16   Ht 6' 1 (1.854 m)   Wt 145 lb (65.8 kg)   BMI 19.13 kg/m    Physical Exam Vitals and nursing note reviewed.  Constitutional:      Appearance: Normal appearance.  HENT:     Head: Normocephalic and atraumatic.  Eyes:      Conjunctiva/sclera: Conjunctivae normal.  Cardiovascular:     Rate and Rhythm: Normal rate and regular rhythm.  Pulmonary:     Effort: Pulmonary effort is normal.     Breath sounds: Normal breath sounds.  Musculoskeletal:     Right lower leg: No edema.     Left lower leg: No edema.  Skin:    General: Skin is warm and dry.  Neurological:     Mental Status: He is alert and oriented to person, place, and time.  Psychiatric:        Mood and Affect: Mood normal.        Behavior: Behavior normal.        Thought Content: Thought content normal.        Judgment: Judgment normal.          Results for orders placed or performed in visit on 06/24/24  POCT Urinalysis Dipstick  Result Value Ref Range   Color, UA yelow    Clarity, UA clear    Glucose, UA Negative Negative   Bilirubin, UA neg    Ketones, UA neg    Spec Grav, UA  1.020 1.010 - 1.025   Blood, UA neg    pH, UA 5.5 5.0 - 8.0   Protein, UA Negative Negative   Urobilinogen, UA 0.2 0.2 or 1.0 E.U./dL   Nitrite, UA neg    Leukocytes, UA Negative Negative   Appearance clear    Odor none       The ASCVD Risk score (Arnett DK, et al., 2019) failed to calculate for the following reasons:   The valid systolic blood pressure range is 90 to 200 mmHg    Assessment & Plan:  Frequent UTI Assessment & Plan: Urinalysis is completely normal today.  The difference is that he started taking cranberry pills.  This may be working for him.  Please continue will send urine to culture just for reassurance.  Orders: -     POCT urinalysis dipstick -     Urine Culture  Neck pain Assessment & Plan: Already doing physical therapy, taking baclofen  41 mg 4 times daily and massage.  Still has pain and decreased range of motion.  Will check neck films.  May need to check MRI of cervical spine.  Orders: -     DG Cervical Spine Complete; Future  Chronic pain of right wrist Assessment & Plan: Salonpas had no effect at all.  Marijuana  seems to help some.  May consider a trial of low-dose gabapentin.   Neurogenic bladder Assessment & Plan: Does In-N-Out cath 3 times a day for residual volume and uses condom cath in between.   Vitamin D  deficiency Assessment & Plan: Has been taking vitamin D  5000 international units daily.  Will check his vitamin D  level.  Orders: -     VITAMIN D  25 Hydroxy (Vit-D Deficiency, Fractures); Future  Other orders -     Baclofen ; Take 1 tablet (10 mg total) by mouth 4 (four) times daily.  Dispense: 120 each; Refill: 3     Return in about 6 months (around 12/25/2024).    Damyiah Moxley K Juan Olthoff, MD

## 2024-06-24 NOTE — Assessment & Plan Note (Addendum)
 Salonpas had no effect at all.  Marijuana seems to help some.  May consider a trial of low-dose gabapentin.

## 2024-06-25 ENCOUNTER — Telehealth: Payer: Self-pay

## 2024-06-25 NOTE — Telephone Encounter (Signed)
 Copied from CRM #[id number]. Topic: Appointments - Scheduling Inquiry for Clinic >> Jun 25, 2024  2:16 PM Rosaria E wrote: Reason for CRM: Pt wants to come tomorrow at 10:30 am for labs

## 2024-06-25 NOTE — Telephone Encounter (Signed)
 Lab appointment scheduled for 06/26/24 at 10:30 am.

## 2024-06-26 ENCOUNTER — Ambulatory Visit: Payer: Self-pay | Admitting: Family Medicine

## 2024-06-26 ENCOUNTER — Ambulatory Visit

## 2024-06-26 DIAGNOSIS — E559 Vitamin D deficiency, unspecified: Secondary | ICD-10-CM

## 2024-06-26 LAB — URINE CULTURE

## 2024-06-27 LAB — VITAMIN D 25 HYDROXY (VIT D DEFICIENCY, FRACTURES): Vit D, 25-Hydroxy: 27.4 ng/mL — ABNORMAL LOW (ref 30.0–100.0)

## 2024-07-11 ENCOUNTER — Telehealth: Payer: Self-pay | Admitting: Family Medicine

## 2024-07-11 NOTE — Telephone Encounter (Signed)
 Mother walked in stating that [facility] will not process the paperwork for the incontinence supplies without the proper signature / please call [phone]

## 2024-07-12 NOTE — Telephone Encounter (Signed)
 Patient's  mother was informed that office notes have been faxed to [facility] [phone].

## 2024-07-12 NOTE — Telephone Encounter (Signed)
 Copied from CRM #[id number]. Topic: General - Other >> Jul 12, 2024  2:11 PM Delon DASEN wrote: Reason for CRM: mother inquiring about paperwork that needs signature for [employer] - [phone]

## 2024-07-17 ENCOUNTER — Other Ambulatory Visit: Payer: Self-pay | Admitting: Family Medicine

## 2024-07-18 ENCOUNTER — Telehealth: Payer: Self-pay | Admitting: Family Medicine

## 2024-07-18 NOTE — Telephone Encounter (Signed)
 Copied from CRM [id number]. Topic: Clinical - Medication Prior Auth >> Jul 18, 2024  2:16 PM Delon DASEN wrote: Reason for CRM: Harlene with [employer] faxed over [place] for signature

## 2024-07-19 NOTE — Telephone Encounter (Signed)
 Fax returned to me by Dr. Ziglar and has been faxed back to [facility].

## 2024-08-30 ENCOUNTER — Telehealth: Payer: Self-pay | Admitting: Family Medicine

## 2024-08-30 NOTE — Telephone Encounter (Signed)
 Copied from CRM [id number]. Topic: Clinical - Medication Refill >> Aug 30, 2024  3:32 PM Montie POUR wrote: Medication: tamsulosin  (FLOMAX ) 0.4 MG CAPS capsule  Has the patient contacted their pharmacy? Yes (Agent: If no, request that the patient contact the pharmacy for the refill. If patient does not wish to contact the pharmacy document the reason why and proceed with request.) (Agent: If yes, when and what did the pharmacy advise?) Pharmacy needs order to refill  This is the patient's preferred pharmacy:  [id number] [place], [place] - [address] AT [facility] OF [address] [place] [address] [place] [address] Phone: [phone] Fax: [phone]  Is this the correct pharmacy for this prescription? Yes If no, delete pharmacy and type the correct one.   Has the prescription been filled recently? No  Is the patient out of the medication? No - Philip will be out of medication on Sunday night and will need this refill by Monday for Monday's dose  Has the patient been seen for an appointment in the last year OR does the patient have an upcoming appointment? Yes  Can we respond through MyChart? No  Agent: Please be advised that Rx refills may take up to 3 business days. We ask that you follow-up with your pharmacy.

## 2024-09-02 MED ORDER — TAMSULOSIN HCL 0.4 MG PO CAPS: 0.4000 mg | ORAL_CAPSULE | Freq: Every day | ORAL | 1 refills | Status: AC

## 2024-10-14 ENCOUNTER — Other Ambulatory Visit: Payer: Self-pay | Admitting: Family Medicine

## 2024-10-14 ENCOUNTER — Telehealth: Payer: Self-pay | Admitting: Family Medicine

## 2024-10-14 DIAGNOSIS — G8220 Paraplegia, unspecified: Secondary | ICD-10-CM

## 2024-10-14 MED ORDER — APIXABAN 5 MG PO TABS: 5.0000 mg | ORAL_TABLET | Freq: Two times a day (BID) | ORAL | 5 refills | Status: AC

## 2024-10-14 NOTE — Telephone Encounter (Signed)
 Copied from CRM #[id number]. Topic: Clinical - Medication Refill >> Oct 14, 2024 11:09 AM Dedra B wrote: Medication: apixaban  (ELIQUIS ) 5 MG TABS tablet  Has the patient contacted their pharmacy? Yes, no refills   This is the patient's preferred pharmacy:  [id number] [place], [place] - [address] AT [facility] OF [address] [place] [address] [place] [address] Phone: [phone] Fax: [phone]  Is this the correct pharmacy for this prescription? Yes  Has the prescription been filled recently? No  Is the patient out of the medication? No, has enough for two days  Has the patient been seen for an appointment in the last year OR does the patient have an upcoming appointment? Yes  Can we respond through MyChart? Yes  Agent: Please be advised that Rx refills may take up to 3 business days. We ask that you follow-up with your pharmacy.

## 2024-10-14 NOTE — Telephone Encounter (Signed)
 Copied from CRM #[id number]. Topic: Clinical - Medication Question >> Oct 14, 2024 11:13 AM Matthew Davenport wrote: Reason for CRM: Pt's mom, Matthew Davenport, wants to make sure his apixaban  (ELIQUIS ) 5 MG TABS tablet gets refilled today. He was given an emergency supply by the pharmacy and has two left.

## 2024-10-25 ENCOUNTER — Other Ambulatory Visit: Payer: Self-pay | Admitting: Family Medicine

## 2024-10-25 NOTE — Telephone Encounter (Unsigned)
 Copied from CRM [id number]. Topic: Clinical - Medication Refill >> Oct 25, 2024 10:59 AM Montie POUR wrote: Medication:  baclofen  (LIORESAL ) 20 MG tablet  Has the patient contacted their pharmacy? Yes (Agent: If no, request that the patient contact the pharmacy for the refill. If patient does not wish to contact the pharmacy document the reason why and proceed with request.) (Agent: If yes, when and what did the pharmacy advise?) Pharmacy needs order to refill  This is the patient's preferred pharmacy:  [id number] [place], [place] - [address] AT [facility] OF [address] [place] [address] [place] [address] Phone: [phone] Fax: [phone]  Is this the correct pharmacy for this prescription? Yes If no, delete pharmacy and type the correct one.   Has the prescription been filled recently? No  Is the patient out of the medication? Yes - He has been out for 2 days.  Has the patient been seen for an appointment in the last year OR does the patient have an upcoming appointment? Yes  Can we respond through MyChart? No  Agent: Please be advised that Rx refills may take up to 3 business days. We ask that you follow-up with your pharmacy.

## 2024-10-28 MED ORDER — BACLOFEN 20 MG PO TABS: 20.0000 mg | ORAL_TABLET | Freq: Four times a day (QID) | ORAL | 3 refills | Status: AC

## 2024-11-06 ENCOUNTER — Other Ambulatory Visit: Payer: Self-pay | Admitting: Family Medicine

## 2024-12-24 ENCOUNTER — Ambulatory Visit: Payer: Self-pay

## 2024-12-25 ENCOUNTER — Ambulatory Visit: Admitting: Family Medicine

## 2024-12-31 ENCOUNTER — Ambulatory Visit: Admitting: Family Medicine
